# Patient Record
Sex: Female | Born: 1960
Health system: Southern US, Community
[De-identification: ages and names within clinical notes are randomized; demographics above are authoritative.]

## PROBLEM LIST (undated history)

## (undated) DIAGNOSIS — R922 Inconclusive mammogram: Secondary | ICD-10-CM

## (undated) DIAGNOSIS — E7800 Pure hypercholesterolemia, unspecified: Secondary | ICD-10-CM

## (undated) DIAGNOSIS — R9230 Dense breasts, unspecified: Secondary | ICD-10-CM

## (undated) DIAGNOSIS — C8190 Hodgkin lymphoma, unspecified, unspecified site: Secondary | ICD-10-CM

## (undated) HISTORY — DX: Inconclusive mammogram: R92.2

## (undated) HISTORY — DX: Pure hypercholesterolemia, unspecified: E78.00

## (undated) HISTORY — DX: Dense breasts, unspecified: R92.30

## (undated) HISTORY — DX: Hodgkin lymphoma, unspecified, unspecified site: C81.90

---

## 2008-06-07 DIAGNOSIS — C8190 Hodgkin lymphoma, unspecified, unspecified site: Secondary | ICD-10-CM

## 2008-06-07 HISTORY — DX: Hodgkin lymphoma, unspecified, unspecified site: C81.90

## 2012-06-07 LAB — HM COLONOSCOPY: HM Colonoscopy: NORMAL

## 2015-01-13 LAB — HM MAMMOGRAPHY: HM Mammogram: NORMAL

## 2015-08-14 ENCOUNTER — Ambulatory Visit: Payer: BC Managed Care – PPO | Admitting: Family Medicine

## 2015-09-03 ENCOUNTER — Encounter: Payer: Self-pay | Admitting: *Deleted

## 2015-09-03 ENCOUNTER — Telehealth: Payer: Self-pay | Admitting: *Deleted

## 2015-09-03 NOTE — Telephone Encounter (Signed)
Pre-Visit Call completed with patient and chart updated.   Pre-Visit Info documented in Specialty Comments under SnapShot.    

## 2015-09-04 ENCOUNTER — Ambulatory Visit (INDEPENDENT_AMBULATORY_CARE_PROVIDER_SITE_OTHER): Payer: BC Managed Care – PPO | Admitting: Family Medicine

## 2015-09-04 ENCOUNTER — Encounter: Payer: Self-pay | Admitting: Family Medicine

## 2015-09-04 VITALS — BP 130/82 | HR 72 | Temp 98.6°F | Ht 64.0 in | Wt 175.2 lb

## 2015-09-04 DIAGNOSIS — Z1329 Encounter for screening for other suspected endocrine disorder: Secondary | ICD-10-CM

## 2015-09-04 DIAGNOSIS — Z131 Encounter for screening for diabetes mellitus: Secondary | ICD-10-CM | POA: Diagnosis not present

## 2015-09-04 DIAGNOSIS — Z7189 Other specified counseling: Secondary | ICD-10-CM

## 2015-09-04 DIAGNOSIS — Z8639 Personal history of other endocrine, nutritional and metabolic disease: Secondary | ICD-10-CM | POA: Diagnosis not present

## 2015-09-04 DIAGNOSIS — Z130 Encounter for screening for diseases of the blood and blood-forming organs and certain disorders involving the immune mechanism: Secondary | ICD-10-CM | POA: Diagnosis not present

## 2015-09-04 DIAGNOSIS — E785 Hyperlipidemia, unspecified: Secondary | ICD-10-CM | POA: Insufficient documentation

## 2015-09-04 DIAGNOSIS — Z8571 Personal history of Hodgkin lymphoma: Secondary | ICD-10-CM | POA: Diagnosis not present

## 2015-09-04 DIAGNOSIS — N6012 Diffuse cystic mastopathy of left breast: Secondary | ICD-10-CM

## 2015-09-04 DIAGNOSIS — Z7689 Persons encountering health services in other specified circumstances: Secondary | ICD-10-CM

## 2015-09-04 LAB — CBC WITH DIFFERENTIAL/PLATELET
BASOS ABS: 0 10*3/uL (ref 0.0–0.1)
Basophils Relative: 0.5 % (ref 0.0–3.0)
EOS ABS: 0.2 10*3/uL (ref 0.0–0.7)
Eosinophils Relative: 4.2 % (ref 0.0–5.0)
HCT: 39.3 % (ref 36.0–46.0)
Hemoglobin: 13.2 g/dL (ref 12.0–15.0)
LYMPHS ABS: 0.9 10*3/uL (ref 0.7–4.0)
Lymphocytes Relative: 21.8 % (ref 12.0–46.0)
MCHC: 33.5 g/dL (ref 30.0–36.0)
MCV: 85.9 fl (ref 78.0–100.0)
MONO ABS: 0.3 10*3/uL (ref 0.1–1.0)
Monocytes Relative: 7 % (ref 3.0–12.0)
NEUTROS PCT: 66.5 % (ref 43.0–77.0)
Neutro Abs: 2.7 10*3/uL (ref 1.4–7.7)
Platelets: 238 10*3/uL (ref 150.0–400.0)
RBC: 4.58 Mil/uL (ref 3.87–5.11)
RDW: 13.6 % (ref 11.5–15.5)
WBC: 4 10*3/uL (ref 4.0–10.5)

## 2015-09-04 LAB — COMPREHENSIVE METABOLIC PANEL
ALK PHOS: 85 U/L (ref 39–117)
ALT: 20 U/L (ref 0–35)
AST: 25 U/L (ref 0–37)
Albumin: 4.5 g/dL (ref 3.5–5.2)
BILIRUBIN TOTAL: 0.6 mg/dL (ref 0.2–1.2)
BUN: 17 mg/dL (ref 6–23)
CO2: 26 meq/L (ref 19–32)
Calcium: 9.6 mg/dL (ref 8.4–10.5)
Chloride: 107 mEq/L (ref 96–112)
Creatinine, Ser: 0.85 mg/dL (ref 0.40–1.20)
GFR: 74.25 mL/min (ref >60.00)
GLUCOSE: 92 mg/dL (ref 70–99)
Potassium: 4 mEq/L (ref 3.5–5.1)
SODIUM: 142 meq/L (ref 135–145)
TOTAL PROTEIN: 7 g/dL (ref 6.0–8.3)

## 2015-09-04 LAB — LIPID PANEL
CHOL/HDL RATIO: 3
Cholesterol: 150 mg/dL (ref 0–200)
HDL: 46.7 mg/dL (ref >39.00)
LDL Cholesterol: 90 mg/dL (ref 0–99)
NONHDL: 103.23
Triglycerides: 65 mg/dL (ref 0.0–149.0)
VLDL: 13 mg/dL (ref 0.0–40.0)

## 2015-09-04 LAB — TSH: TSH: 1.5 u[IU]/mL (ref 0.35–4.50)

## 2015-09-04 LAB — HEMOGLOBIN A1C: Hgb A1c MFr Bld: 6.1 % (ref 4.6–6.5)

## 2015-09-04 MED ORDER — ASPIRIN 81 MG PO TABS: 81.0000 mg | ORAL_TABLET | Freq: Every day | ORAL | Status: AC

## 2015-09-04 MED ORDER — ATORVASTATIN CALCIUM 20 MG PO TABS: 20.0000 mg | ORAL_TABLET | Freq: Every day | ORAL | Status: DC

## 2015-09-04 NOTE — Patient Instructions (Signed)
It was very nice to meet you today!  We will arrange for a mammogram at the [facility] for you I will be in touch with your labs asap

## 2015-09-04 NOTE — Progress Notes (Signed)
[place] at [facility] [address], [place], [place] [address] [phone]  Date:  09/04/2015   Name:  Jennifer Wheeler   DOB:  Dec 13, 1960   MRN:  [id number]  PCP:  Lamar Blinks, MD    Chief Complaint: New Patient (Initial Visit)   History of Present Illness:  Jennifer Wheeler is a 55 y.o. very pleasant female patient who presents with the following:  Recently moved to the area from [place]. Her husband took a job in this area- he is a primary care sports medicine doctor who is working with [employer].    Dx with Hodgins lymphoma in 2010- she had chemo for 6 months.  A year ago she had her 5 year follow-up and was told that she does not need to see oncology any longer.  She does need to have annual labs per her PCP  She takes lipitor for her choleterol.    She is otherwise doing well.   She has never been a smoker They have 3 children- the older children are out of school, live [place] and [place], and the youngest is a Ship broker at [employer] [place].    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    She would like to do her labs today. She is fasting today.   She would like a rec for a GYN.  Also she has noticed a tender and "bumpy" area in her left breast- se has noted it over the last few weeks.  She has had normal mammos years since age 23.  She has dense breasts so often needs extra imaging. Last mammo done in august of 2016 She has started doing jazzercise for exercise.    There are no active problems to display for this patient.   Past Medical History  Diagnosis Date  . Hodgkin's lymphoma ([place]) 2010  . Hypercholesteremia   . Dense breast tissue     Past Surgical History  Procedure Laterality Date  . Cesarean section  1991    Social History  Substance Use  Topics  . Smoking status: Never Smoker   . Smokeless tobacco: Never Used  . Alcohol Use: 0.0 oz/week    0 Standard drinks or equivalent per week     Comment: occasionally    Family History  Problem Relation Age of Onset  . Heart disease Mother   . Heart attack Mother   . Diabetes Maternal Grandmother     No Known Allergies  Medication list has been reviewed and updated.  Current Outpatient Prescriptions on File Prior to Visit  Medication Sig Dispense Refill  . cetirizine (ZYRTEC) 10 MG tablet Take 10 mg by mouth daily.    . Multiple Vitamins-Minerals (MULTIPLE VITAMINS/WOMENS PO) Take 1 tablet by mouth daily.     No current facility-administered medications on file prior to visit.    Review of Systems:  As per HPI- otherwise negative.   Physical Examination: Filed Vitals:   09/04/15 0945  BP: 130/82  Pulse: 72  Temp: 98.6 F (37 C)    GEN: WDWN, NAD, Non-toxic, A & O x 3, overweight, looks well HEENT: Atraumatic, Normocephalic. Neck supple. No masses, No LAD.  Bilateral TM wnl, oropharynx normal.  PEERL,EOMI.   Ears and Nose: No external deformity. CV: RRR, No M/G/R.  No JVD. No thrill. No extra heart sounds. PULM: CTA B, no wheezes, crackles, rhonchi. No retractions. No resp. distress. No accessory muscle use. ABD: S, NT, ND, +BS. No rebound. No HSM. EXTR: No c/c/e NEURO Normal gait.  PSYCH: Normally interactive. Conversant. Not depressed or anxious appearing.  Calm demeanor.  Breast: normal exam, no masses/ dimpling/ discharge. She notes an area on the left breast from approx 2- 5:00 that feels like fibrocystic change but that she does identify as different from her baseline     Assessment and Plan: History of hyperlipidemia - Plan: Lipid panel, atorvastatin (LIPITOR) 20 MG tablet  Screening for deficiency anemia - Plan: CBC with Differential/Platelet  History of hodgkin's lymphoma - Plan: CBC with Differential/Platelet  Establishing care with new doctor,  encounter for  Screening for diabetes mellitus - Plan: Comprehensive metabolic panel, Hemoglobin A1c  Screening for thyroid disorder - Plan: TSH  Breast changes, fibrocystic, left - Plan: MM Digital Diagnostic Bilat  Labs today Referral for a diagnostic mammo Surveillance for lymphoma with a CBC with diff today Check her lipids- if doing well she can decrease her lipitor to 10 mg if she likes   Signed Lamar Blinks, MD

## 2015-09-05 ENCOUNTER — Encounter: Payer: Self-pay | Admitting: Family Medicine

## 2015-09-06 ENCOUNTER — Other Ambulatory Visit: Payer: Self-pay | Admitting: Family Medicine

## 2015-09-06 DIAGNOSIS — N6012 Diffuse cystic mastopathy of left breast: Secondary | ICD-10-CM

## 2015-10-06 ENCOUNTER — Ambulatory Visit
Admission: RE | Admit: 2015-10-06 | Discharge: 2015-10-06 | Disposition: A | Discharge status: Home / Self Care | Payer: BC Managed Care – PPO | Source: Ambulatory Visit | Attending: Family Medicine | Admitting: Family Medicine

## 2015-10-06 ENCOUNTER — Other Ambulatory Visit: Payer: Self-pay | Admitting: Family Medicine

## 2015-10-06 DIAGNOSIS — N6012 Diffuse cystic mastopathy of left breast: Secondary | ICD-10-CM

## 2015-10-23 ENCOUNTER — Telehealth: Payer: Self-pay

## 2015-10-23 NOTE — Telephone Encounter (Signed)
Patient called, states insurance  Denied claim  For MAmmogram. Will not pay for 2nd Mammogram. Patient feels if there was mor investigational info it may help. Please advise.

## 2015-10-24 NOTE — Telephone Encounter (Signed)
Called her back and LMOM- does she have any more info about denial such as a letter?  I need to find out who to contact to try and get this approved

## 2015-11-07 ENCOUNTER — Encounter: Payer: Self-pay | Admitting: Family Medicine

## 2015-11-19 ENCOUNTER — Encounter: Payer: Self-pay | Admitting: Family Medicine

## 2016-01-12 ENCOUNTER — Other Ambulatory Visit: Payer: Self-pay | Admitting: Family Medicine

## 2016-01-12 DIAGNOSIS — Z1231 Encounter for screening mammogram for malignant neoplasm of breast: Secondary | ICD-10-CM

## 2016-01-19 ENCOUNTER — Ambulatory Visit
Admission: RE | Admit: 2016-01-19 | Discharge: 2016-01-19 | Disposition: A | Discharge status: Home / Self Care | Payer: BC Managed Care – PPO | Source: Ambulatory Visit | Attending: Family Medicine | Admitting: Family Medicine

## 2016-01-19 DIAGNOSIS — Z1231 Encounter for screening mammogram for malignant neoplasm of breast: Secondary | ICD-10-CM

## 2016-04-12 ENCOUNTER — Ambulatory Visit (INDEPENDENT_AMBULATORY_CARE_PROVIDER_SITE_OTHER): Payer: BC Managed Care – PPO | Admitting: Family Medicine

## 2016-04-12 ENCOUNTER — Other Ambulatory Visit (HOSPITAL_COMMUNITY)
Admission: RE | Admit: 2016-04-12 | Discharge: 2016-04-12 | Disposition: A | Discharge status: Home / Self Care | Payer: BC Managed Care – PPO | Source: Ambulatory Visit | Attending: Family Medicine | Admitting: Family Medicine

## 2016-04-12 ENCOUNTER — Encounter: Payer: Self-pay | Admitting: Family Medicine

## 2016-04-12 VITALS — BP 126/82 | HR 84 | Temp 98.0°F | Ht 64.0 in | Wt 175.4 lb

## 2016-04-12 DIAGNOSIS — Z01419 Encounter for gynecological examination (general) (routine) without abnormal findings: Secondary | ICD-10-CM | POA: Diagnosis present

## 2016-04-12 DIAGNOSIS — Z1151 Encounter for screening for human papillomavirus (HPV): Secondary | ICD-10-CM | POA: Diagnosis not present

## 2016-04-12 DIAGNOSIS — J3089 Other allergic rhinitis: Secondary | ICD-10-CM | POA: Diagnosis not present

## 2016-04-12 DIAGNOSIS — H1013 Acute atopic conjunctivitis, bilateral: Secondary | ICD-10-CM

## 2016-04-12 DIAGNOSIS — E785 Hyperlipidemia, unspecified: Secondary | ICD-10-CM

## 2016-04-12 DIAGNOSIS — Z124 Encounter for screening for malignant neoplasm of cervix: Secondary | ICD-10-CM

## 2016-04-12 DIAGNOSIS — Z119 Encounter for screening for infectious and parasitic diseases, unspecified: Secondary | ICD-10-CM

## 2016-04-12 DIAGNOSIS — Z0000 Encounter for general adult medical examination without abnormal findings: Secondary | ICD-10-CM | POA: Diagnosis not present

## 2016-04-12 DIAGNOSIS — Z111 Encounter for screening for respiratory tuberculosis: Secondary | ICD-10-CM

## 2016-04-12 DIAGNOSIS — L679 Hair color and hair shaft abnormality, unspecified: Secondary | ICD-10-CM

## 2016-04-12 DIAGNOSIS — R7309 Other abnormal glucose: Secondary | ICD-10-CM

## 2016-04-12 MED ORDER — KETOTIFEN FUMARATE 0.025 % OP SOLN: 2.0000 [drp] | Freq: Two times a day (BID) | OPHTHALMIC | 11 refills | Status: DC

## 2016-04-12 MED ORDER — CETIRIZINE HCL 10 MG PO TABS: 10.0000 mg | ORAL_TABLET | Freq: Every day | ORAL | 11 refills | Status: DC

## 2016-04-12 NOTE — Progress Notes (Signed)
Pre visit review using our clinic review tool, if applicable. No additional management support is needed unless otherwise documented below in the visit note. 

## 2016-04-12 NOTE — Progress Notes (Signed)
[place] at [facility] [address] [place], [place] [address] [phone]  Date:  04/12/2016   Name:  Jennifer Wheeler   DOB:  1960/12/04   MRN:  [id number]  PCP:  Lamar Blinks, MD    Chief Complaint: Annual Exam (Pt here for physical. )   History of Present Illness:  Jennifer Wheeler is a 55 y.o. very pleasant female patient who presents with the following:  Here today for a CPE. Seen by myself in March of this year- they moved here this year from [place] as her husband took a job with [employer] sports [employer] (he is a sports med doc)  See partial HPI from March:  Dx with Hodgins lymphoma in 2010- she had chemo for 6 months.  A year ago she had her 5 year follow-up and was told that she does not need to see oncology any longer.  She does need to have annual labs per her PCP She takes lipitor for her choleterol.   She is otherwise doing well.   She has never been a smoker They have 3 children- the older children are out of school, live [place] and [place], and the youngest is a Ship broker at [employer] [place].   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   We did labs for her in March that looked fine-   mammo done in August of this year Last pap: 2014. She has never had an abnl pap  She does need hep C screening  Her oldest got engaged and is getting married in the spring, and her youngest is playing baseball at college.    She needs an rx for her allergy eye drops and her zyrtec so that she can get these under her flex account.    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  She would like to do a quantiferon gold for ease   Patient Active Problem List   Diagnosis Date Noted  . History of Hodgkin's lymphoma 09/04/2015  .  Hyperlipidemia 09/04/2015    Past Medical History:  Diagnosis Date  . Dense breast tissue   . Hodgkin's lymphoma ([place]) 2010  . Hypercholesteremia     Past Surgical History:  Procedure Laterality Date  . [place]    Social History  Substance Use Topics  . Smoking status: Never Smoker  . Smokeless tobacco: Never Used  . Alcohol use 0.0 oz/week     Comment: occasionally    Family History  Problem Relation Age of Onset  . Heart disease Mother   . Heart attack Mother   . Hypertension Mother   . Hyperlipidemia Maternal Grandmother   . Heart disease Maternal Grandmother   . Kidney Stones Father   . Melanoma Sister   . Kidney Stones Sister   . Colon cancer Maternal Grandfather   . Diabetes Maternal Grandfather   . Breast cancer Paternal Grandmother   . High blood pressure Paternal Grandmother     No Known Allergies  Medication list has been reviewed and updated.  Current Outpatient Prescriptions on File Prior to Visit  Medication Sig Dispense Refill  . aspirin 81 MG tablet Take 1 tablet (81 mg total) by mouth daily. 90 tablet 3  .  atorvastatin (LIPITOR) 20 MG tablet Take 1 tablet (20 mg total) by mouth daily. 90 tablet 3  . Biotin (BIOTIN MAXIMUM STRENGTH) 10 MG TABS Take 1 tablet by mouth daily.    . cetirizine (ZYRTEC) 10 MG tablet Take 10 mg by mouth daily.    . Multiple Vitamins-Minerals (MULTIPLE VITAMINS/WOMENS PO) Take 1 tablet by mouth daily.     No current facility-administered medications on file prior to visit.     Review of Systems:  As per HPI- otherwise negative.   Physical Examination: Vitals:   04/12/16 1413  BP: 126/82  Pulse: 84  Temp: 98 F (36.7 C)   Wt Readings from Last 3 Encounters:  04/12/16 175 lb 6.4 oz (79.6 kg)  09/04/15 175 lb 3.2 oz (79.5 kg)   Body mass index is 30.11 kg/m.  Ideal Body Weight:    GEN: WDWN, NAD, Non-toxic, A & O x 3, overweight, looks well HEENT: Atraumatic, Normocephalic. Neck supple.  No masses, No LAD.  Bilateral TM wnl, oropharynx normal.  PEERL,EOMI.  Ears and Nose: No external deformity. CV: RRR, No M/G/R. No JVD. No thrill. No extra heart sounds. PULM: CTA B, no wheezes, crackles, rhonchi. No retractions. No resp. distress. No accessory muscle use. ABD: S, NT, ND, +BS. No rebound. No HSM. EXTR: No c/c/e NEURO Normal gait.  PSYCH: Normally interactive. Conversant. Not depressed or anxious appearing.  Calm demeanor.  Breast: normal exam, no masses/ dimpling/ discharge Pelvic: normal, no vaginal lesions or discharge. Uterus normal, no CMT, no adnexal tendereness or masses She has noted some thinning of the hair in the lateral portion of the left eyebrow. She thinks this may have started back when she had treatment for her lymphoma   Assessment and Plan: Physical exam  Chronic non-seasonal allergic rhinitis, unspecified trigger - Plan: cetirizine (ZYRTEC) 10 MG tablet  Allergic conjunctivitis of both eyes - Plan: ketotifen (ZADITOR) 0.025 % ophthalmic solution  Dyslipidemia - Plan: Lipid panel  Elevated hemoglobin A1c - Plan: Hemoglobin A1C  Hair changes - Plan: Ambulatory referral to Dermatology  Screening examination for infectious disease - Plan: Hepatitis C antibody  Screening for cervical cancer - Plan: Cytology - PAP  Screening for tuberculosis - Plan: Quantiferon tb gold assay  Here today for a CPE and labs.   Pap today Referral to derm to eval her eyebrow concern  Signed Lamar Blinks, MD

## 2016-04-14 LAB — CYTOLOGY - PAP
DIAGNOSIS: NEGATIVE
HPV (WINDOPATH): NOT DETECTED

## 2016-04-19 ENCOUNTER — Telehealth: Payer: Self-pay | Admitting: Family Medicine

## 2016-04-19 ENCOUNTER — Encounter: Payer: Self-pay | Admitting: Family Medicine

## 2016-04-19 DIAGNOSIS — Z111 Encounter for screening for respiratory tuberculosis: Secondary | ICD-10-CM

## 2016-04-19 NOTE — Telephone Encounter (Signed)
Relation to pt: self Call back number: [phone]   Reason for call:   patient changed her mind and would like TB skin testing rather then Quantiferon tb gold assay. Patient would like to schedule nurse visit for Wednesday or Friday, requesting orders.

## 2016-04-19 NOTE — Telephone Encounter (Signed)
Ok placed PPD order

## 2016-04-20 NOTE — Telephone Encounter (Signed)
Patient scheduled for 04/21/2016

## 2016-04-21 ENCOUNTER — Encounter: Payer: Self-pay | Admitting: Family Medicine

## 2016-04-21 ENCOUNTER — Ambulatory Visit (INDEPENDENT_AMBULATORY_CARE_PROVIDER_SITE_OTHER): Payer: BC Managed Care – PPO

## 2016-04-21 DIAGNOSIS — Z111 Encounter for screening for respiratory tuberculosis: Secondary | ICD-10-CM

## 2016-04-21 NOTE — Progress Notes (Signed)
Pre visit review using our clinic tool,if applicable. No additional management support is needed unless otherwise documented below in the visit note.   Patient in for PPD. Given left forearm SQ. Advised to return on Friday to have PPD read. Patient agreed

## 2016-04-23 ENCOUNTER — Other Ambulatory Visit (INDEPENDENT_AMBULATORY_CARE_PROVIDER_SITE_OTHER): Payer: BC Managed Care – PPO

## 2016-04-23 ENCOUNTER — Encounter: Payer: Self-pay | Admitting: Family Medicine

## 2016-04-23 DIAGNOSIS — E785 Hyperlipidemia, unspecified: Secondary | ICD-10-CM

## 2016-04-23 DIAGNOSIS — Z119 Encounter for screening for infectious and parasitic diseases, unspecified: Secondary | ICD-10-CM

## 2016-04-23 DIAGNOSIS — R7309 Other abnormal glucose: Secondary | ICD-10-CM | POA: Diagnosis not present

## 2016-04-23 LAB — TB SKIN TEST
Induration: 0 mm
TB Skin Test: NEGATIVE

## 2016-04-23 LAB — LIPID PANEL
CHOLESTEROL: 164 mg/dL (ref 0–200)
HDL: 48.1 mg/dL (ref >39.00)
LDL Cholesterol: 95 mg/dL (ref 0–99)
NonHDL: 115.84
Total CHOL/HDL Ratio: 3
Triglycerides: 102 mg/dL (ref 0.0–149.0)
VLDL: 20.4 mg/dL (ref 0.0–40.0)

## 2016-04-23 LAB — HEMOGLOBIN A1C: HEMOGLOBIN A1C: 5.8 % (ref 4.6–6.5)

## 2016-04-24 LAB — HEPATITIS C ANTIBODY: HCV Ab: NEGATIVE

## 2016-04-26 ENCOUNTER — Other Ambulatory Visit: Payer: BC Managed Care – PPO

## 2016-10-11 ENCOUNTER — Other Ambulatory Visit: Payer: Self-pay | Admitting: Family Medicine

## 2016-10-11 DIAGNOSIS — Z1231 Encounter for screening mammogram for malignant neoplasm of breast: Secondary | ICD-10-CM

## 2017-01-19 ENCOUNTER — Ambulatory Visit
Admission: RE | Admit: 2017-01-19 | Discharge: 2017-01-19 | Disposition: A | Discharge status: Home / Self Care | Payer: BC Managed Care – PPO | Source: Ambulatory Visit | Attending: Family Medicine | Admitting: Family Medicine

## 2017-01-19 DIAGNOSIS — Z1231 Encounter for screening mammogram for malignant neoplasm of breast: Secondary | ICD-10-CM

## 2017-01-21 ENCOUNTER — Other Ambulatory Visit: Payer: Self-pay | Admitting: Family Medicine

## 2017-01-21 DIAGNOSIS — R928 Other abnormal and inconclusive findings on diagnostic imaging of breast: Secondary | ICD-10-CM

## 2017-01-24 ENCOUNTER — Telehealth: Payer: Self-pay | Admitting: *Deleted

## 2017-01-24 NOTE — Telephone Encounter (Signed)
Received Physician Orders from [place]; forwarded to provider/SLS 08/20

## 2017-01-28 ENCOUNTER — Ambulatory Visit
Admission: RE | Admit: 2017-01-28 | Discharge: 2017-01-28 | Disposition: A | Discharge status: Home / Self Care | Payer: BC Managed Care – PPO | Source: Ambulatory Visit | Attending: Family Medicine | Admitting: Family Medicine

## 2017-01-28 ENCOUNTER — Ambulatory Visit: Admission: RE | Admit: 2017-01-28 | Payer: BC Managed Care – PPO | Source: Ambulatory Visit

## 2017-01-28 DIAGNOSIS — R928 Other abnormal and inconclusive findings on diagnostic imaging of breast: Secondary | ICD-10-CM

## 2017-04-12 NOTE — Progress Notes (Addendum)
[place] at [facility] [address], [place], [place] [address] [phone]  Date:  04/14/2017   Name:  Jennifer Wheeler   DOB:  03/11/1961   MRN:  [id number]  PCP:  Darreld Mclean, MD    Chief Complaint: Annual Exam   History of Present Illness:  Jennifer Wheeler is a 56 y.o. very pleasant female patient who presents with the following:  CPE today History of Hodgkins' disease and hyperlipidemia  Pap: 2017, negative, - HPV Mammo: 8/18 Colon: 2014 Flu:  done Tetanus: update today Labs:  Due  I last saw her about a year ago:  Here today for a CPE. Seen by myself in March of this year- they moved here this year from [place] as her husband took a job with [employer] sports [employer] (he is a sports med doc)  See partial HPI from March:  Dx with Hodgins lymphoma in 2010- she had chemo for 6 months. A year ago she had her 5 year follow-up and was told that she does not need to see oncology any longer. She does need to have annual labs per her PCP She takes lipitor for her choleterol.  She is otherwise doing well.  She has never been a smoker They have 3 children- the older children are out of school, live [place] and [place], and the youngest is a Ship broker at [employer] [place].  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   We did labs for her in March that looked fine-  mammo done in August of this year Last pap: 2014. She has never had an abnl pap  She does need hep C screening  Her oldest got engaged and is getting married in the spring, and her youngest is playing baseball at college.    She is doing well overall She needs her blood tests done to follow-up on her Hodgkins today She has lost a couple of lbs, and plans to try weight watchers She does walk  for exercise and enjoys jazzercise  Her oldest child got married last year, the middle one is engaged, and her youngest is in med school at [employer].   She is not fasting today- she had an english muffin and some meat, cheese.  However her cholesterol has looked fine on her lipitor in the past- will go ahead and get labs for her today  She is taking 10 mg of lipitor right now and tolerating this well  Patient Active Problem List   Diagnosis Date Noted  . History of Hodgkin's lymphoma 09/04/2015  . Hyperlipidemia 09/04/2015    Past Medical History:  Diagnosis Date  . Dense breast tissue   . Hodgkin's lymphoma ([place]) 2010  . Hypercholesteremia     Past Surgical History:  Procedure Laterality Date  . CESAREAN SECTION  1991    Social History   Tobacco Use  . Smoking status: Never Smoker  . Smokeless tobacco: Never Used  Substance Use Topics  . Alcohol use: Yes    Alcohol/week: 0.0 oz    Comment: occasionally  . Drug use: No    Family History  Problem Relation Age of Onset  . Heart disease Mother   . Heart attack Mother   . Hypertension Mother   . Kidney Stones Father   .  Hyperlipidemia Maternal Grandmother   . Heart disease Maternal Grandmother   . Melanoma Sister   . Kidney Stones Sister   . Colon cancer Maternal Grandfather   . Diabetes Maternal Grandfather   . Breast cancer Paternal Grandmother   . High blood pressure Paternal Grandmother     No Known Allergies  Medication list has been reviewed and updated.  Current Outpatient Medications on File Prior to Visit  Medication Sig Dispense Refill  . aspirin 81 MG tablet Take 1 tablet (81 mg total) by mouth daily. 90 tablet 3  . Biotin (BIOTIN MAXIMUM STRENGTH) 10 MG TABS Take 1 tablet by mouth daily.    . cetirizine (ZYRTEC) 10 MG tablet Take 1 tablet (10 mg total) by mouth daily. 30 tablet 11  . ketotifen (ZADITOR) 0.025 % ophthalmic solution Place 2 drops into both eyes 2 (two) times daily. 5 mL 11  .  Multiple Vitamins-Minerals (MULTIPLE VITAMINS/WOMENS PO) Take 1 tablet by mouth daily.     No current facility-administered medications on file prior to visit.     Review of Systems:  As per HPI- otherwise negative. No CP or SOB with exercise No breast changes No ST or cough Skin check next month   Physical Examination: Vitals:   04/14/17 1316  BP: 134/82  Pulse: 67  Resp: 16  Temp: 98.8 F (37.1 C)  SpO2: 100%   Vitals:   04/14/17 1316  Weight: 171 lb 6.4 oz (77.7 kg)   Body mass index is 29.42 kg/m. Ideal Body Weight:    GEN: WDWN, NAD, Non-toxic, A & O x 3, overweight, looks well otherwise  HEENT: Atraumatic, Normocephalic. Neck supple. No masses, No LAD.  Bilateral TM wnl, oropharynx normal.  PEERL,EOMI.   Ears and Nose: No external deformity. CV: RRR, No M/G/R. No JVD. No thrill. No extra heart sounds. PULM: CTA B, no wheezes, crackles, rhonchi. No retractions. No resp. distress. No accessory muscle use. ABD: S, NT, ND, +BS. No rebound. No HSM. EXTR: No c/c/e NEURO Normal gait.  PSYCH: Normally interactive. Conversant. Not depressed or anxious appearing.  Calm demeanor.    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 Encouraged continued exercise   Signed Lamar Blinks, MD  Received her labs Results for orders placed or performed in visit on 04/14/17  Comprehensive metabolic panel  Result  Value Ref Range   Sodium 139 135 - 145 mEq/L   Potassium 3.8 3.5 - 5.1 mEq/L   Chloride 104 96 - 112 mEq/L   CO2 26 19 - 32 mEq/L   Glucose, Bld 103 (H) 70 - 99 mg/dL   BUN 19 6 - 23 mg/dL   Creatinine, Ser 0.90 0.40 - 1.20 mg/dL   Total Bilirubin 0.6 0.2 - 1.2 mg/dL   Alkaline Phosphatase 69 39 - 117 U/L   AST 37 0 - 37 U/L   ALT 24 0 - 35 U/L   Total Protein 7.2 6.0 - 8.3  g/dL   Albumin 4.5 3.5 - 5.2 g/dL   Calcium 9.9 8.4 - 10.5 mg/dL   GFR 69.09 >60.00 mL/min  Hemoglobin A1c  Result Value Ref Range   Hgb A1c MFr Bld 5.9 4.6 - 6.5 %  Lipid panel  Result Value Ref Range   Cholesterol 158 0 - 200 mg/dL   Triglycerides 66.0 0.0 - 149.0 mg/dL   HDL 47.90 >39.00 mg/dL   VLDL 13.2 0.0 - 40.0 mg/dL   LDL Cholesterol 97 0 - 99 mg/dL   Total CHOL/HDL Ratio 3    NonHDL 109.75   CBC with Differential/Platelet  Result Value Ref Range   WBC 4.6 4.0 - 10.5 K/uL   RBC 4.71 3.87 - 5.11 Mil/uL   Hemoglobin 13.9 12.0 - 15.0 g/dL   HCT 41.3 36.0 - 46.0 %   MCV 87.7 78.0 - 100.0 fl   MCHC 33.6 30.0 - 36.0 g/dL   RDW 13.2 11.5 - 15.5 %   Platelets 229.0 150.0 - 400.0 K/uL   Neutrophils Relative % 56.8 43.0 - 77.0 %   Lymphocytes Relative 28.9 12.0 - 46.0 %   Monocytes Relative 7.8 3.0 - 12.0 %   Eosinophils Relative 4.8 0.0 - 5.0 %   Basophils Relative 1.7 0.0 - 3.0 %   Neutro Abs 2.6 1.4 - 7.7 K/uL   Lymphs Abs 1.3 0.7 - 4.0 K/uL   Monocytes Absolute 0.4 0.1 - 1.0 K/uL   Eosinophils Absolute 0.2 0.0 - 0.7 K/uL   Basophils Absolute 0.1 0.0 - 0.1 K/uL   Message to pt- A1c is stable

## 2017-04-14 ENCOUNTER — Encounter: Payer: Self-pay | Admitting: Family Medicine

## 2017-04-14 ENCOUNTER — Ambulatory Visit (INDEPENDENT_AMBULATORY_CARE_PROVIDER_SITE_OTHER): Payer: BC Managed Care – PPO | Admitting: Family Medicine

## 2017-04-14 VITALS — BP 134/82 | HR 67 | Temp 98.8°F | Resp 16 | Wt 171.4 lb

## 2017-04-14 DIAGNOSIS — R7303 Prediabetes: Secondary | ICD-10-CM | POA: Insufficient documentation

## 2017-04-14 DIAGNOSIS — Z8571 Personal history of Hodgkin lymphoma: Secondary | ICD-10-CM | POA: Diagnosis not present

## 2017-04-14 DIAGNOSIS — Z0000 Encounter for general adult medical examination without abnormal findings: Secondary | ICD-10-CM

## 2017-04-14 DIAGNOSIS — Z23 Encounter for immunization: Secondary | ICD-10-CM | POA: Diagnosis not present

## 2017-04-14 DIAGNOSIS — Z8639 Personal history of other endocrine, nutritional and metabolic disease: Secondary | ICD-10-CM | POA: Diagnosis not present

## 2017-04-14 DIAGNOSIS — Z131 Encounter for screening for diabetes mellitus: Secondary | ICD-10-CM

## 2017-04-14 DIAGNOSIS — Z130 Encounter for screening for diseases of the blood and blood-forming organs and certain disorders involving the immune mechanism: Secondary | ICD-10-CM

## 2017-04-14 LAB — CBC WITH DIFFERENTIAL/PLATELET
Basophils Absolute: 0.1 10*3/uL (ref 0.0–0.1)
Basophils Relative: 1.7 % (ref 0.0–3.0)
EOS PCT: 4.8 % (ref 0.0–5.0)
Eosinophils Absolute: 0.2 10*3/uL (ref 0.0–0.7)
HCT: 41.3 % (ref 36.0–46.0)
Hemoglobin: 13.9 g/dL (ref 12.0–15.0)
LYMPHS ABS: 1.3 10*3/uL (ref 0.7–4.0)
Lymphocytes Relative: 28.9 % (ref 12.0–46.0)
MCHC: 33.6 g/dL (ref 30.0–36.0)
MCV: 87.7 fl (ref 78.0–100.0)
MONO ABS: 0.4 10*3/uL (ref 0.1–1.0)
Monocytes Relative: 7.8 % (ref 3.0–12.0)
NEUTROS ABS: 2.6 10*3/uL (ref 1.4–7.7)
NEUTROS PCT: 56.8 % (ref 43.0–77.0)
PLATELETS: 229 10*3/uL (ref 150.0–400.0)
RBC: 4.71 Mil/uL (ref 3.87–5.11)
RDW: 13.2 % (ref 11.5–15.5)
WBC: 4.6 10*3/uL (ref 4.0–10.5)

## 2017-04-14 LAB — HEMOGLOBIN A1C: HEMOGLOBIN A1C: 5.9 % (ref 4.6–6.5)

## 2017-04-14 LAB — LIPID PANEL
CHOLESTEROL: 158 mg/dL (ref 0–200)
HDL: 47.9 mg/dL (ref >39.00)
LDL Cholesterol: 97 mg/dL (ref 0–99)
NONHDL: 109.75
TRIGLYCERIDES: 66 mg/dL (ref 0.0–149.0)
Total CHOL/HDL Ratio: 3
VLDL: 13.2 mg/dL (ref 0.0–40.0)

## 2017-04-14 LAB — COMPREHENSIVE METABOLIC PANEL
ALBUMIN: 4.5 g/dL (ref 3.5–5.2)
ALT: 24 U/L (ref 0–35)
AST: 37 U/L (ref 0–37)
Alkaline Phosphatase: 69 U/L (ref 39–117)
BUN: 19 mg/dL (ref 6–23)
CHLORIDE: 104 meq/L (ref 96–112)
CO2: 26 mEq/L (ref 19–32)
Calcium: 9.9 mg/dL (ref 8.4–10.5)
Creatinine, Ser: 0.9 mg/dL (ref 0.40–1.20)
GFR: 69.09 mL/min (ref >60.00)
Glucose, Bld: 103 mg/dL — ABNORMAL HIGH (ref 70–99)
POTASSIUM: 3.8 meq/L (ref 3.5–5.1)
SODIUM: 139 meq/L (ref 135–145)
Total Bilirubin: 0.6 mg/dL (ref 0.2–1.2)
Total Protein: 7.2 g/dL (ref 6.0–8.3)

## 2017-04-14 MED ORDER — ATORVASTATIN CALCIUM 10 MG PO TABS: 10.0000 mg | ORAL_TABLET | Freq: Every day | ORAL | 3 refills | Status: DC

## 2017-04-14 NOTE — Patient Instructions (Signed)
It was a pleasure to see you today as always!  Take care and I will be in touch with your labs asap You got your Tdap booster today- this is a tetanus shot that also protects against the whooping cough (pertussis)    Health Maintenance for Postmenopausal Women Menopause is a normal process in which your reproductive ability comes to an end. This process happens gradually over a span of months to years, usually between the ages of 38 and 51. Menopause is complete when you have missed 12 consecutive menstrual periods. It is important to talk with your health care provider about some of the most common conditions that affect postmenopausal women, such as heart disease, cancer, and bone loss (osteoporosis). Adopting a healthy lifestyle and getting preventive care can help to promote your health and wellness. Those actions can also lower your chances of developing some of these common conditions. What should I know about menopause? During menopause, you may experience a number of symptoms, such as:  Moderate-to-severe hot flashes.  Night sweats.  Decrease in sex drive.  Mood swings.  Headaches.  Tiredness.  Irritability.  Memory problems.  Insomnia.  Choosing to treat or not to treat menopausal changes is an individual decision that you make with your health care provider. What should I know about hormone replacement therapy and supplements? Hormone therapy products are effective for treating symptoms that are associated with menopause, such as hot flashes and night sweats. Hormone replacement carries certain risks, especially as you become older. If you are thinking about using estrogen or estrogen with progestin treatments, discuss the benefits and risks with your health care provider. What should I know about heart disease and stroke? Heart disease, heart attack, and stroke become more likely as you age. This may be due, in part, to the hormonal changes that your body experiences during  menopause. These can affect how your body processes dietary fats, triglycerides, and cholesterol. Heart attack and stroke are both medical emergencies. There are many things that you can do to help prevent heart disease and stroke:  Have your blood pressure checked at least every 1-2 years. High blood pressure causes heart disease and increases the risk of stroke.  If you are 84-36 years old, ask your health care provider if you should take aspirin to prevent a heart attack or a stroke.  Do not use any tobacco products, including cigarettes, chewing tobacco, or electronic cigarettes. If you need help quitting, ask your health care provider.  It is important to eat a healthy diet and maintain a healthy weight. ? Be sure to include plenty of vegetables, fruits, low-fat dairy products, and lean protein. ? Avoid eating foods that are high in solid fats, added sugars, or salt (sodium).  Get regular exercise. This is one of the most important things that you can do for your health. ? Try to exercise for at least 150 minutes each week. The type of exercise that you do should increase your heart rate and make you sweat. This is known as moderate-intensity exercise. ? Try to do strengthening exercises at least twice each week. Do these in addition to the moderate-intensity exercise.  Know your numbers.Ask your health care provider to check your cholesterol and your blood glucose. Continue to have your blood tested as directed by your health care provider.  What should I know about cancer screening? There are several types of cancer. Take the following steps to reduce your risk and to catch any cancer development as early  as possible. Breast Cancer  Practice breast self-awareness. ? This means understanding how your breasts normally appear and feel. ? It also means doing regular breast self-exams. Let your health care provider know about any changes, no matter how small.  If you are 15 or older,  have a clinician do a breast exam (clinical breast exam or CBE) every year. Depending on your age, family history, and medical history, it may be recommended that you also have a yearly breast X-ray (mammogram).  If you have a family history of breast cancer, talk with your health care provider about genetic screening.  If you are at high risk for breast cancer, talk with your health care provider about having an MRI and a mammogram every year.  Breast cancer (BRCA) gene test is recommended for women who have family members with BRCA-related cancers. Results of the assessment will determine the need for genetic counseling and BRCA1 and for BRCA2 testing. BRCA-related cancers include these types: ? Breast. This occurs in males or females. ? Ovarian. ? Tubal. This may also be called fallopian tube cancer. ? Cancer of the abdominal or pelvic lining (peritoneal cancer). ? Prostate. ? Pancreatic.  Cervical, Uterine, and Ovarian Cancer Your health care provider may recommend that you be screened regularly for cancer of the pelvic organs. These include your ovaries, uterus, and vagina. This screening involves a pelvic exam, which includes checking for microscopic changes to the surface of your cervix (Pap test).  For women ages 21-65, health care providers may recommend a pelvic exam and a Pap test every three years. For women ages 64-65, they may recommend the Pap test and pelvic exam, combined with testing for human papilloma virus (HPV), every five years. Some types of HPV increase your risk of cervical cancer. Testing for HPV may also be done on women of any age who have unclear Pap test results.  Other health care providers may not recommend any screening for nonpregnant women who are considered low risk for pelvic cancer and have no symptoms. Ask your health care provider if a screening pelvic exam is right for you.  If you have had past treatment for cervical cancer or a condition that could  lead to cancer, you need Pap tests and screening for cancer for at least 20 years after your treatment. If Pap tests have been discontinued for you, your risk factors (such as having a new sexual partner) need to be reassessed to determine if you should start having screenings again. Some women have medical problems that increase the chance of getting cervical cancer. In these cases, your health care provider may recommend that you have screening and Pap tests more often.  If you have a family history of uterine cancer or ovarian cancer, talk with your health care provider about genetic screening.  If you have vaginal bleeding after reaching menopause, tell your health care provider.  There are currently no reliable tests available to screen for ovarian cancer.  Lung Cancer Lung cancer screening is recommended for adults 95-73 years old who are at high risk for lung cancer because of a history of smoking. A yearly low-dose CT scan of the lungs is recommended if you:  Currently smoke.  Have a history of at least 30 pack-years of smoking and you currently smoke or have quit within the past 15 years. A pack-year is smoking an average of one pack of cigarettes per day for one year.  Yearly screening should:  Continue until it has been 15  years since you quit.  Stop if you develop a health problem that would prevent you from having lung cancer treatment.  Colorectal Cancer  This type of cancer can be detected and can often be prevented.  Routine colorectal cancer screening usually begins at age 68 and continues through age 23.  If you have risk factors for colon cancer, your health care provider may recommend that you be screened at an earlier age.  If you have a family history of colorectal cancer, talk with your health care provider about genetic screening.  Your health care provider may also recommend using home test kits to check for hidden blood in your stool.  A small camera at the  end of a tube can be used to examine your colon directly (sigmoidoscopy or colonoscopy). This is done to check for the earliest forms of colorectal cancer.  Direct examination of the colon should be repeated every 5-10 years until age 37. However, if early forms of precancerous polyps or small growths are found or if you have a family history or genetic risk for colorectal cancer, you may need to be screened more often.  Skin Cancer  Check your skin from head to toe regularly.  Monitor any moles. Be sure to tell your health care provider: ? About any new moles or changes in moles, especially if there is a change in a mole's shape or color. ? If you have a mole that is larger than the size of a pencil eraser.  If any of your family members has a history of skin cancer, especially at a young age, talk with your health care provider about genetic screening.  Always use sunscreen. Apply sunscreen liberally and repeatedly throughout the day.  Whenever you are outside, protect yourself by wearing long sleeves, pants, a wide-brimmed hat, and sunglasses.  What should I know about osteoporosis? Osteoporosis is a condition in which bone destruction happens more quickly than new bone creation. After menopause, you may be at an increased risk for osteoporosis. To help prevent osteoporosis or the bone fractures that can happen because of osteoporosis, the following is recommended:  If you are 20-65 years old, get at least 1,000 mg of calcium and at least 600 mg of vitamin D per day.  If you are older than age 12 but younger than age 40, get at least 1,200 mg of calcium and at least 600 mg of vitamin D per day.  If you are older than age 29, get at least 1,200 mg of calcium and at least 800 mg of vitamin D per day.  Smoking and excessive alcohol intake increase the risk of osteoporosis. Eat foods that are rich in calcium and vitamin D, and do weight-bearing exercises several times each week as directed  by your health care provider. What should I know about how menopause affects my mental health? Depression may occur at any age, but it is more common as you become older. Common symptoms of depression include:  Low or sad mood.  Changes in sleep patterns.  Changes in appetite or eating patterns.  Feeling an overall lack of motivation or enjoyment of activities that you previously enjoyed.  Frequent crying spells.  Talk with your health care provider if you think that you are experiencing depression. What should I know about immunizations? It is important that you get and maintain your immunizations. These include:  Tetanus, diphtheria, and pertussis (Tdap) booster vaccine.  Influenza every year before the flu season begins.  Pneumonia vaccine.  Shingles vaccine.  Your health care provider may also recommend other immunizations. This information is not intended to replace advice given to you by your health care provider. Make sure you discuss any questions you have with your health care provider. Document Released: 07/16/2005 Document Revised: 12/12/2015 Document Reviewed: 02/25/2015 Elsevier Interactive Patient Education  2018 [employer].

## 2017-11-02 ENCOUNTER — Other Ambulatory Visit: Payer: Self-pay | Admitting: Family Medicine

## 2017-11-02 DIAGNOSIS — Z1231 Encounter for screening mammogram for malignant neoplasm of breast: Secondary | ICD-10-CM

## 2018-01-20 ENCOUNTER — Ambulatory Visit
Admission: RE | Admit: 2018-01-20 | Discharge: 2018-01-20 | Disposition: A | Discharge status: Home / Self Care | Payer: BC Managed Care – PPO | Source: Ambulatory Visit | Attending: Family Medicine | Admitting: Family Medicine

## 2018-01-20 DIAGNOSIS — Z1231 Encounter for screening mammogram for malignant neoplasm of breast: Secondary | ICD-10-CM

## 2018-04-10 ENCOUNTER — Other Ambulatory Visit: Payer: Self-pay | Admitting: Family Medicine

## 2018-04-10 DIAGNOSIS — Z8639 Personal history of other endocrine, nutritional and metabolic disease: Secondary | ICD-10-CM

## 2018-04-17 ENCOUNTER — Encounter: Payer: BC Managed Care – PPO | Admitting: Family Medicine

## 2018-04-23 NOTE — Progress Notes (Signed)
[facility] [place], [place], [place] [address] [phone]  Date:  04/24/2018   Name:  Jennifer Wheeler   DOB:  1961-03-31   MRN:  [id number]  PCP:  Darreld Mclean, MD    Chief Complaint: Annual Exam   History of Present Illness:  Jennifer Wheeler is a 57 y.o. 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 She does not have time to drink much water as she is working so hard, this can make her feel a bit dehydrated by the end of the day   She is still exercising and has lost a few labs She likes to go to [place] No CP or SOB No PMB Mood is good, no depression   Her middle son is now happily married as well,her youngest is doing well in his 2nd year at [employer] Readings from Last 3 Encounters:  04/24/18 169 lb (76.7 kg)  04/14/17 171 lb 6.4 oz (77.7 kg)  04/12/16 175 lb 6.4 oz (79.6 kg)    From our visit a year ago: They moved here this year from [place] as her husband took a job with [employer] (he is a sports med Firefighter) Dx with [place] in 2010- she had chemo for 6 months. A year ago she had her 5 year follow-up and was told that she does not need to see oncology any longer. She does need to have annual labs per her PCP She takes lipitor for her choleterol.  She is otherwise doing well.  She has never been a smoker They have 3 children- the older children are out of school, live [place] and [place], and the youngest is a Ship broker at [employer] of  [place].  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 She does walk for exercise and enjoys jazzercise Her oldest child got married last year, the middle one is engaged, and her youngest is in med school at [employer].    Patient Active Problem List   Diagnosis Date Noted  . Pre-diabetes 04/14/2017  . History of Hodgkin's lymphoma 09/04/2015  . Hyperlipidemia 09/04/2015    Past Medical History:  Diagnosis Date  . Dense breast tissue   . Hodgkin's lymphoma ([place]) 2010  . Hypercholesteremia     Past Surgical History:  Procedure Laterality Date  . CESAREAN SECTION  1991    Social History   Tobacco Use  . Smoking status: Never Smoker  . Smokeless tobacco: Never Used  Substance Use Topics  . Alcohol use: Yes    Alcohol/week: 0.0 standard drinks    Comment: occasionally  . Drug use: No    Family History  Problem Relation Age of Onset  . Heart disease Mother   . Heart attack Mother   . Hypertension Mother   . Kidney Stones Father   .  Hyperlipidemia Maternal Grandmother   . Heart disease Maternal Grandmother   . Melanoma Sister   . Kidney Stones Sister   . Colon cancer Maternal Grandfather   . Diabetes Maternal Grandfather   . Breast cancer Paternal Grandmother   . High blood pressure Paternal Grandmother     No Known Allergies  Medication list has been reviewed and updated.  Current Outpatient Medications on File Prior to Visit  Medication Sig Dispense Refill  . aspirin 81 MG tablet Take 1 tablet (81 mg total) by mouth daily. 90 tablet 3  . atorvastatin (LIPITOR) 10 MG tablet TAKE 1 TABLET (10 MG TOTAL) DAILY BY MOUTH. 15 tablet 0  . Biotin (BIOTIN MAXIMUM STRENGTH) 10 MG TABS Take 1 tablet by mouth daily.    . Multiple Vitamins-Minerals (MULTIPLE VITAMINS/WOMENS PO) Take 1 tablet by mouth daily.    . cetirizine  (ZYRTEC) 10 MG tablet Take 1 tablet (10 mg total) by mouth daily. (Patient not taking: Reported on 04/24/2018) 30 tablet 11   No current facility-administered medications on file prior to visit.     Review of Systems:  As per HPI- otherwise negative. BP Readings from Last 3 Encounters:  04/24/18 130/70  04/14/17 134/82  04/12/16 126/82    No breast or skin concerns   Physical Examination: Vitals:   04/24/18 1338  BP: 130/70  Pulse: 78  Resp: 16  Temp: 98.1 F (36.7 C)  SpO2: 99%   Vitals:   04/24/18 1338  Weight: 169 lb (76.7 kg)  Height: 5\' 4"  (1.626 m)   Body mass index is 29.01 kg/m. Ideal Body Weight: Weight in (lb) to have BMI = 25: 145.3  GEN: WDWN, NAD, Non-toxic, A & O x 3, overweight, looks well  HEENT: Atraumatic, Normocephalic. Neck supple. No masses, No LAD. Bilateral TM wnl, oropharynx normal.  PEERL,EOMI.   Ears and Nose: No external deformity. CV: RRR, No M/G/R. No JVD. No thrill. No extra heart sounds. PULM: CTA B, no wheezes, crackles, rhonchi. No retractions. No resp. distress. No accessory muscle use. ABD: S, NT, ND. No rebound. No HSM. EXTR: No c/c/e NEURO Normal gait.  PSYCH: Normally interactive. Conversant. Not depressed or anxious appearing.  Calm demeanor.    Assessment and Plan: Physical exam  Screening for deficiency anemia - Plan: CBC  History of Hodgkin's lymphoma - Plan: CBC, Comprehensive metabolic panel  Screening for diabetes mellitus - Plan: Comprehensive metabolic panel, Hemoglobin A1c  Pre-diabetes - Plan: Hemoglobin A1c  Mixed hyperlipidemia - Plan: Lipid panel  History of hyperlipidemia - Plan: atorvastatin (LIPITOR) 10 MG tablet  CPE today She plans to come in for fasting labs in the next few days  Continue lipitor, refilled for her today She may come in for shinrix later on Otherwise all is UTD and she has no concerns   Signed Lamar Blinks, MD

## 2018-04-24 ENCOUNTER — Ambulatory Visit (INDEPENDENT_AMBULATORY_CARE_PROVIDER_SITE_OTHER): Payer: Managed Care, Other (non HMO) | Admitting: Family Medicine

## 2018-04-24 ENCOUNTER — Encounter: Payer: Self-pay | Admitting: Family Medicine

## 2018-04-24 VITALS — BP 130/70 | HR 78 | Temp 98.1°F | Resp 16 | Ht 64.0 in | Wt 169.0 lb

## 2018-04-24 DIAGNOSIS — Z8571 Personal history of Hodgkin lymphoma: Secondary | ICD-10-CM

## 2018-04-24 DIAGNOSIS — Z130 Encounter for screening for diseases of the blood and blood-forming organs and certain disorders involving the immune mechanism: Secondary | ICD-10-CM

## 2018-04-24 DIAGNOSIS — R7303 Prediabetes: Secondary | ICD-10-CM

## 2018-04-24 DIAGNOSIS — Z131 Encounter for screening for diabetes mellitus: Secondary | ICD-10-CM | POA: Diagnosis not present

## 2018-04-24 DIAGNOSIS — Z0000 Encounter for general adult medical examination without abnormal findings: Secondary | ICD-10-CM

## 2018-04-24 DIAGNOSIS — Z8639 Personal history of other endocrine, nutritional and metabolic disease: Secondary | ICD-10-CM

## 2018-04-24 DIAGNOSIS — E782 Mixed hyperlipidemia: Secondary | ICD-10-CM

## 2018-04-24 MED ORDER — ATORVASTATIN CALCIUM 10 MG PO TABS: 10.0000 mg | ORAL_TABLET | Freq: Every day | ORAL | 3 refills | Status: DC

## 2018-04-24 NOTE — Patient Instructions (Addendum)
Great to see you today! Please come in for fasting labs at your convenience Also consider the Shingrix vaccine to prevent shingles; we can give this to you if you like, it is a 2 shot series   Continue exercise, great job with weight loss Best of luck with your new job!    Health Maintenance, Female Adopting a healthy lifestyle and getting preventive care can go a long way to promote health and wellness. Talk with your health care provider about what schedule of regular examinations is right for you. This is a good chance for you to check in with your provider about disease prevention and staying healthy. In between checkups, there are plenty of things you can do on your own. Experts have done a lot of research about which lifestyle changes and preventive measures are most likely to keep you healthy. Ask your health care provider for more information. Weight and diet Eat a healthy diet  Be sure to include plenty of vegetables, fruits, low-fat dairy products, and lean protein.  Do not eat a lot of foods high in solid fats, added sugars, or salt.  Get regular exercise. This is one of the most important things you can do for your health. ? Most adults should exercise for at least 150 minutes each week. The exercise should increase your heart rate and make you sweat (moderate-intensity exercise). ? Most adults should also do strengthening exercises at least twice a week. This is in addition to the moderate-intensity exercise.  Maintain a healthy weight  Body mass index (BMI) is a measurement that can be used to identify possible weight problems. It estimates body fat based on height and weight. Your health care provider can help determine your BMI and help you achieve or maintain a healthy weight.  For females 52 years of age and older: ? A BMI below 18.5 is considered underweight. ? A BMI of 18.5 to 24.9 is normal. ? A BMI of 25 to 29.9 is considered overweight. ? A BMI of 30 and above is  considered obese.  Watch levels of cholesterol and blood lipids  You should start having your blood tested for lipids and cholesterol at 56 years of age, then have this test every 5 years.  You may need to have your cholesterol levels checked more often if: ? Your lipid or cholesterol levels are high. ? You are older than 56 years of age. ? You are at high risk for heart disease.  Cancer screening Lung Cancer  Lung cancer screening is recommended for adults 7-63 years old who are at high risk for lung cancer because of a history of smoking.  A yearly low-dose CT scan of the lungs is recommended for people who: ? Currently smoke. ? Have quit within the past 15 years. ? Have at least a 30-pack-year history of smoking. A pack year is smoking an average of one pack of cigarettes a day for 1 year.  Yearly screening should continue until it has been 15 years since you quit.  Yearly screening should stop if you develop a health problem that would prevent you from having lung cancer treatment.  Breast Cancer  Practice breast self-awareness. This means understanding how your breasts normally appear and feel.  It also means doing regular breast self-exams. Let your health care provider know about any changes, no matter how small.  If you are in your 20s or 30s, you should have a clinical breast exam (CBE) by a health care provider every  1-3 years as part of a regular health exam.  If you are 23 or older, have a CBE every year. Also consider having a breast X-ray (mammogram) every year.  If you have a family history of breast cancer, talk to your health care provider about genetic screening.  If you are at high risk for breast cancer, talk to your health care provider about having an MRI and a mammogram every year.  Breast cancer gene (BRCA) assessment is recommended for women who have family members with BRCA-related cancers. BRCA-related cancers  include: ? Breast. ? Ovarian. ? Tubal. ? Peritoneal cancers.  Results of the assessment will determine the need for genetic counseling and BRCA1 and BRCA2 testing.  Cervical Cancer Your health care provider may recommend that you be screened regularly for cancer of the pelvic organs (ovaries, uterus, and vagina). This screening involves a pelvic examination, including checking for microscopic changes to the surface of your cervix (Pap test). You may be encouraged to have this screening done every 3 years, beginning at age 71.  For women ages 51-65, health care providers may recommend pelvic exams and Pap testing every 3 years, or they may recommend the Pap and pelvic exam, combined with testing for human papilloma virus (HPV), every 5 years. Some types of HPV increase your risk of cervical cancer. Testing for HPV may also be done on women of any age with unclear Pap test results.  Other health care providers may not recommend any screening for nonpregnant women who are considered low risk for pelvic cancer and who do not have symptoms. Ask your health care provider if a screening pelvic exam is right for you.  If you have had past treatment for cervical cancer or a condition that could lead to cancer, you need Pap tests and screening for cancer for at least 20 years after your treatment. If Pap tests have been discontinued, your risk factors (such as having a new sexual partner) need to be reassessed to determine if screening should resume. Some women have medical problems that increase the chance of getting cervical cancer. In these cases, your health care provider may recommend more frequent screening and Pap tests.  Colorectal Cancer  This type of cancer can be detected and often prevented.  Routine colorectal cancer screening usually begins at 56 years of age and continues through 56 years of age.  Your health care provider may recommend screening at an earlier age if you have risk factors  for colon cancer.  Your health care provider may also recommend using home test kits to check for hidden blood in the stool.  A small camera at the end of a tube can be used to examine your colon directly (sigmoidoscopy or colonoscopy). This is done to check for the earliest forms of colorectal cancer.  Routine screening usually begins at age 30.  Direct examination of the colon should be repeated every 5-10 years through 56 years of age. However, you may need to be screened more often if early forms of precancerous polyps or small growths are found.  Skin Cancer  Check your skin from head to toe regularly.  Tell your health care provider about any new moles or changes in moles, especially if there is a change in a mole's shape or color.  Also tell your health care provider if you have a mole that is larger than the size of a pencil eraser.  Always use sunscreen. Apply sunscreen liberally and repeatedly throughout the day.  Protect  yourself by wearing long sleeves, pants, a wide-brimmed hat, and sunglasses whenever you are outside.  Heart disease, diabetes, and high blood pressure  High blood pressure causes heart disease and increases the risk of stroke. High blood pressure is more likely to develop in: ? People who have blood pressure in the high end of the normal range (130-139/85-89 mm Hg). ? People who are overweight or obese. ? People who are African American.  If you are 76-29 years of age, have your blood pressure checked every 3-5 years. If you are 55 years of age or older, have your blood pressure checked every year. You should have your blood pressure measured twice-once when you are at a hospital or clinic, and once when you are not at a hospital or clinic. Record the average of the two measurements. To check your blood pressure when you are not at a hospital or clinic, you can use: ? An automated blood pressure machine at a pharmacy. ? A home blood pressure monitor.  If  you are between 64 years and 74 years old, ask your health care provider if you should take aspirin to prevent strokes.  Have regular diabetes screenings. This involves taking a blood sample to check your fasting blood sugar level. ? If you are at a normal weight and have a low risk for diabetes, have this test once every three years after 56 years of age. ? If you are overweight and have a high risk for diabetes, consider being tested at a younger age or more often. Preventing infection Hepatitis B  If you have a higher risk for hepatitis B, you should be screened for this virus. You are considered at high risk for hepatitis B if: ? You were born in a country where hepatitis B is common. Ask your health care provider which countries are considered high risk. ? Your parents were born in a high-risk country, and you have not been immunized against hepatitis B (hepatitis B vaccine). ? You have HIV or AIDS. ? You use needles to inject street drugs. ? You live with someone who has hepatitis B. ? You have had sex with someone who has hepatitis B. ? You get hemodialysis treatment. ? You take certain medicines for conditions, including cancer, organ transplantation, and autoimmune conditions.  Hepatitis C  Blood testing is recommended for: ? Everyone born from [birth year] through 1964. ? Anyone with known risk factors for hepatitis C.  Sexually transmitted infections (STIs)  You should be screened for sexually transmitted infections (STIs) including gonorrhea and chlamydia if: ? You are sexually active and are younger than 56 years of age. ? You are older than 56 years of age and your health care provider tells you that you are at risk for this type of infection. ? Your sexual activity has changed since you were last screened and you are at an increased risk for chlamydia or gonorrhea. Ask your health care provider if you are at risk.  If you do not have HIV, but are at risk, it may be recommended  that you take a prescription medicine daily to prevent HIV infection. This is called pre-exposure prophylaxis (PrEP). You are considered at risk if: ? You are sexually active and do not regularly use condoms or know the HIV status of your partner(s). ? You take drugs by injection. ? You are sexually active with a partner who has HIV.  Talk with your health care provider about whether you are at high risk of being  infected with HIV. If you choose to begin PrEP, you should first be tested for HIV. You should then be tested every 3 months for as long as you are taking PrEP. Pregnancy  If you are premenopausal and you may become pregnant, ask your health care provider about preconception counseling.  If you may become pregnant, take 400 to 800 micrograms (mcg) of folic acid every day.  If you want to prevent pregnancy, talk to your health care provider about birth control (contraception). Osteoporosis and menopause  Osteoporosis is a disease in which the bones lose minerals and strength with aging. This can result in serious bone fractures. Your risk for osteoporosis can be identified using a bone density scan.  If you are 68 years of age or older, or if you are at risk for osteoporosis and fractures, ask your health care provider if you should be screened.  Ask your health care provider whether you should take a calcium or vitamin D supplement to lower your risk for osteoporosis.  Menopause may have certain physical symptoms and risks.  Hormone replacement therapy may reduce some of these symptoms and risks. Talk to your health care provider about whether hormone replacement therapy is right for you. Follow these instructions at home:  Schedule regular health, dental, and eye exams.  Stay current with your immunizations.  Do not use any tobacco products including cigarettes, chewing tobacco, or electronic cigarettes.  If you are pregnant, do not drink alcohol.  If you are  breastfeeding, limit how much and how often you drink alcohol.  Limit alcohol intake to no more than 1 drink per day for nonpregnant women. One drink equals 12 ounces of beer, 5 ounces of wine, or 1 ounces of hard liquor.  Do not use street drugs.  Do not share needles.  Ask your health care provider for help if you need support or information about quitting drugs.  Tell your health care provider if you often feel depressed.  Tell your health care provider if you have ever been abused or do not feel safe at home. This information is not intended to replace advice given to you by your health care provider. Make sure you discuss any questions you have with your health care provider. Document Released: 12/07/2010 Document Revised: 10/30/2015 Document Reviewed: 02/25/2015 Elsevier Interactive Patient Education  [employer].

## 2018-04-27 ENCOUNTER — Other Ambulatory Visit (INDEPENDENT_AMBULATORY_CARE_PROVIDER_SITE_OTHER): Payer: Managed Care, Other (non HMO)

## 2018-04-27 ENCOUNTER — Encounter: Payer: Self-pay | Admitting: Family Medicine

## 2018-04-27 DIAGNOSIS — Z8571 Personal history of Hodgkin lymphoma: Secondary | ICD-10-CM

## 2018-04-27 DIAGNOSIS — Z131 Encounter for screening for diabetes mellitus: Secondary | ICD-10-CM | POA: Diagnosis not present

## 2018-04-27 DIAGNOSIS — R7303 Prediabetes: Secondary | ICD-10-CM

## 2018-04-27 DIAGNOSIS — E782 Mixed hyperlipidemia: Secondary | ICD-10-CM

## 2018-04-27 LAB — COMPREHENSIVE METABOLIC PANEL
ALT: 23 U/L (ref 0–35)
AST: 23 U/L (ref 0–37)
Albumin: 4.7 g/dL (ref 3.5–5.2)
Alkaline Phosphatase: 76 U/L (ref 39–117)
BILIRUBIN TOTAL: 0.8 mg/dL (ref 0.2–1.2)
BUN: 18 mg/dL (ref 6–23)
CALCIUM: 9.5 mg/dL (ref 8.4–10.5)
CHLORIDE: 107 meq/L (ref 96–112)
CO2: 26 meq/L (ref 19–32)
CREATININE: 0.97 mg/dL (ref 0.40–1.20)
GFR: 63.13 mL/min (ref >60.00)
GLUCOSE: 103 mg/dL — AB (ref 70–99)
Potassium: 3.8 mEq/L (ref 3.5–5.1)
Sodium: 143 mEq/L (ref 135–145)
Total Protein: 6.9 g/dL (ref 6.0–8.3)

## 2018-04-27 LAB — CBC WITH DIFFERENTIAL/PLATELET
BASOS PCT: 1.2 % (ref 0.0–3.0)
Basophils Absolute: 0.1 10*3/uL (ref 0.0–0.1)
EOS PCT: 4.9 % (ref 0.0–5.0)
Eosinophils Absolute: 0.2 10*3/uL (ref 0.0–0.7)
HEMATOCRIT: 41.1 % (ref 36.0–46.0)
Hemoglobin: 14 g/dL (ref 12.0–15.0)
LYMPHS ABS: 1.4 10*3/uL (ref 0.7–4.0)
LYMPHS PCT: 30.6 % (ref 12.0–46.0)
MCHC: 34.2 g/dL (ref 30.0–36.0)
MCV: 86.7 fl (ref 78.0–100.0)
Monocytes Absolute: 0.4 10*3/uL (ref 0.1–1.0)
Monocytes Relative: 8.4 % (ref 3.0–12.0)
NEUTROS ABS: 2.5 10*3/uL (ref 1.4–7.7)
NEUTROS PCT: 54.9 % (ref 43.0–77.0)
PLATELETS: 236 10*3/uL (ref 150.0–400.0)
RBC: 4.74 Mil/uL (ref 3.87–5.11)
RDW: 13.1 % (ref 11.5–15.5)
WBC: 4.5 10*3/uL (ref 4.0–10.5)

## 2018-04-27 LAB — LIPID PANEL
CHOL/HDL RATIO: 3
Cholesterol: 172 mg/dL (ref 0–200)
HDL: 50.5 mg/dL (ref >39.00)
LDL Cholesterol: 102 mg/dL — ABNORMAL HIGH (ref 0–99)
NONHDL: 121.5
Triglycerides: 97 mg/dL (ref 0.0–149.0)
VLDL: 19.4 mg/dL (ref 0.0–40.0)

## 2018-04-27 LAB — HEMOGLOBIN A1C: HEMOGLOBIN A1C: 6 % (ref 4.6–6.5)

## 2018-06-05 ENCOUNTER — Encounter: Payer: Self-pay | Admitting: Family Medicine

## 2018-06-05 DIAGNOSIS — Z8639 Personal history of other endocrine, nutritional and metabolic disease: Secondary | ICD-10-CM

## 2018-06-06 NOTE — Telephone Encounter (Signed)
90 day supply with 3 refills was sent to pharmacy on 04/24/18. Please advise about mammogram order.

## 2018-08-08 ENCOUNTER — Encounter: Payer: Self-pay | Admitting: Family Medicine

## 2018-08-08 DIAGNOSIS — Z8639 Personal history of other endocrine, nutritional and metabolic disease: Secondary | ICD-10-CM

## 2018-08-08 MED ORDER — ATORVASTATIN CALCIUM 10 MG PO TABS: 10.0000 mg | ORAL_TABLET | Freq: Every day | ORAL | 3 refills | Status: DC

## 2018-08-09 MED FILL — ATORVASTATIN 10 MG TABLET: 10 | 90 days supply | Qty: 90 | Fill #0

## 2018-08-11 ENCOUNTER — Other Ambulatory Visit: Payer: Self-pay | Admitting: Family Medicine

## 2018-08-11 DIAGNOSIS — Z1231 Encounter for screening mammogram for malignant neoplasm of breast: Secondary | ICD-10-CM

## 2018-12-01 MED FILL — ATORVASTATIN 10 MG TABLET: 10 | 90 days supply | Qty: 90 | Fill #1

## 2019-01-22 ENCOUNTER — Ambulatory Visit: Payer: Managed Care, Other (non HMO)

## 2019-03-06 ENCOUNTER — Ambulatory Visit: Payer: Managed Care, Other (non HMO)

## 2019-03-07 ENCOUNTER — Other Ambulatory Visit: Payer: Self-pay

## 2019-03-07 ENCOUNTER — Ambulatory Visit
Admission: RE | Admit: 2019-03-07 | Discharge: 2019-03-07 | Disposition: A | Discharge status: Home / Self Care | Payer: 59 | Source: Ambulatory Visit | Attending: Family Medicine | Admitting: Family Medicine

## 2019-03-07 DIAGNOSIS — Z1231 Encounter for screening mammogram for malignant neoplasm of breast: Secondary | ICD-10-CM

## 2019-04-19 ENCOUNTER — Encounter: Payer: Self-pay | Admitting: Family Medicine

## 2019-04-19 DIAGNOSIS — Z8639 Personal history of other endocrine, nutritional and metabolic disease: Secondary | ICD-10-CM

## 2019-04-20 MED ORDER — ATORVASTATIN CALCIUM 10 MG PO TABS: 10.0000 mg | ORAL_TABLET | Freq: Every day | ORAL | 3 refills | Status: DC

## 2019-07-26 ENCOUNTER — Ambulatory Visit: Payer: 59

## 2019-07-30 ENCOUNTER — Ambulatory Visit: Payer: 59 | Attending: Family

## 2019-07-30 DIAGNOSIS — Z23 Encounter for immunization: Secondary | ICD-10-CM

## 2019-07-30 NOTE — Progress Notes (Signed)
   Covid-19 Vaccination Clinic  Name:  Jennifer Wheeler    MRN: [id number] DOB: 11/08/1960  07/30/2019  [name] was observed post Covid-19 immunization for 15 minutes without incidence. She was provided with Vaccine Information Sheet and instruction to access the V-Safe system.   [name] was instructed to call 911 with any severe reactions post vaccine: Marland Kitchen Difficulty breathing  . Swelling of your face and throat  . A fast heartbeat  . A bad rash all over your body  . Dizziness and weakness    Immunizations Administered    Name Date Dose VIS Date Route   Moderna COVID-19 Vaccine 07/30/2019  4:00 PM 0.5 mL 05/08/2019 Intramuscular   Manufacturer: Moderna   Lot: [id number]   [id number]

## 2019-08-28 ENCOUNTER — Ambulatory Visit: Payer: 59 | Attending: Family

## 2019-08-28 DIAGNOSIS — Z23 Encounter for immunization: Secondary | ICD-10-CM

## 2019-08-28 NOTE — Progress Notes (Signed)
   Covid-19 Vaccination Clinic  Name:  [name]    MRN: [id number] DOB: 1960/10/08  08/28/2019  [name] was observed post Covid-19 immunization for 15 minutes without incident. She was provided with Vaccine Information Sheet and instruction to access the V-Safe system.   [name] was instructed to call 911 with any severe reactions post vaccine: Marland Kitchen Difficulty breathing  . Swelling of face and throat  . A fast heartbeat  . A bad rash all over body  . Dizziness and weakness   Immunizations Administered    Name Date Dose VIS Date Route   Moderna COVID-19 Vaccine 08/28/2019  3:50 PM 0.5 mL 05/08/2019 Intramuscular   Manufacturer: Moderna   LotMV:[id number]   [place]BE:[id number]

## 2019-09-04 ENCOUNTER — Ambulatory Visit: Payer: 59

## 2019-12-10 NOTE — Progress Notes (Signed)
[facility] [place], [place], [place] [address] [phone]  Date:  12/13/2019   Name:  Jennifer Wheeler   DOB:  10/15/1960   MRN:  [id number]  PCP:  Darreld Mclean, MD    Chief Complaint: Annual Exam (pap smear)   History of Present Illness:  Jennifer Wheeler is a 59 y.o. 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 (he is a sports med Firefighter) Dx with [place] in 2010- she had chemo for 6 months. A year ago she had her 5 year follow-up and was told that she does not need to see oncology any longer. She does need to have annual labs per her PCP She takes lipitor for her choleterol.  She is otherwise doing well.  She has never been a smoker They have 3 children- the older children are out of school, live [place] and [place], and the youngest is a Ship broker at [employer] [place].   Son in [place], married and expecting in January- first grand child  Son in [place], married Youngest son is finishing up med school at [employer]- plans to go into IM  Her husband is now working with [employer] in [place]- this is going ok for him   Pap-due today, never had an abnormal in the past Mammogram up-to-date Colon cancer screen up-to-date COVID-19 series complete Shingrix- not done yet Due for routine labs  Asa 81 lipitor Vitamin  Patient overall is doing  very well, her main concern is that she continues to battle with her weight and she has noticed some thinning of her hair over the last 6 to 7 months.  No bald spots, more of a generalized thinning.  She tends to have fine hair to begin with Patient Active Problem List   Diagnosis Date Noted  . Pre-diabetes 04/14/2017  . History of Hodgkin's lymphoma 09/04/2015  . Hyperlipidemia 09/04/2015    Past Medical History:  Diagnosis Date  . Dense breast tissue   . Hodgkin's lymphoma ([place]) 2010  . Hypercholesteremia     Past Surgical History:  Procedure Laterality Date  . CESAREAN SECTION  1991    Social History   Tobacco Use  . Smoking status: Never Smoker  . Smokeless tobacco: Never Used  Substance Use Topics  . Alcohol use: Yes    Alcohol/week: 0.0 standard drinks    Comment: occasionally  . Drug use: No    Family History  Problem Relation Age of Onset  . Heart disease Mother   . Heart attack Mother   . Hypertension Mother   . Kidney Stones Father   . Hyperlipidemia Maternal Grandmother   . Heart disease Maternal Grandmother   . Melanoma Sister   . Kidney Stones Sister   . Colon cancer Maternal Grandfather   . Diabetes Maternal Grandfather   . Breast cancer Paternal Grandmother   . High blood pressure  Paternal Grandmother     No Known Allergies  Medication list has been reviewed and updated.  Current Outpatient Medications on File Prior to Visit  Medication Sig Dispense Refill  . aspirin 81 MG tablet Take 1 tablet (81 mg total) by mouth daily. 90 tablet 3  . atorvastatin (LIPITOR) 10 MG tablet Take 1 tablet (10 mg total) by mouth daily. 90 tablet 3  . BIOTIN 5000 PO Take 5,000 mcg by mouth daily.    . Multiple Vitamins-Minerals (MULTIPLE VITAMINS/WOMENS PO) Take 1 tablet by mouth daily.     No current facility-administered medications on file prior to visit.    Review of Systems:  As per HPI- otherwise negative.   Physical Examination: Vitals:   12/13/19  1011  BP: 132/82  Pulse: 80  Resp: 17  Temp: 97.8 F (36.6 C)  SpO2: 98%   Vitals:   12/13/19 1011  Weight: 178 lb (80.7 kg)  Height: 5\' 4"  (1.626 m)   Body mass index is 30.55 kg/m. Ideal Body Weight: Weight in (lb) to have BMI = 25: 145.3  GEN: no acute distress.  Obese, otherwise looks well  She does have fine and somewhat thin hair, no bald spots noted, normal hairline HEENT: Atraumatic, Normocephalic.   Bilateral TM wnl, oropharynx normal.  PEERL,EOMI.   Ears and Nose: No external deformity. CV: RRR, No M/G/R. No JVD. No thrill. No extra heart sounds. PULM: CTA B, no wheezes, crackles, rhonchi. No retractions. No resp. distress. No accessory muscle use. ABD: S, NT, ND, +BS. No rebound. No HSM. EXTR: No c/c/e PSYCH: Normally interactive. Conversant.  Normal vulva, vagina, cervix and adnexa Pap collected    Assessment and Plan: Physical exam  History of hyperlipidemia - Plan: Lipid panel  Screening for deficiency anemia - Plan: CBC with Differential/Platelet  Pre-diabetes - Plan: Hemoglobin A1c, Comprehensive metabolic panel  History of Hodgkin's lymphoma  Screening for thyroid disorder - Plan: TSH  Encounter for vitamin deficiency screening - Plan: VITAMIN D 25 Hydroxy (Vit-D Deficiency, Fractures)  Screening for cervical cancer - Plan: Cytology - PAP  Hair loss - Plan: TSH, Ferritin  Encounter for screening mammogram for malignant neoplasm of breast - Plan: MM 3D SCREEN BREAST BILATERAL  Screening for skin cancer - Plan: Ambulatory referral to Dermatology  Here today for a complete physical.  Labs pending as above.  We will be in touch with patient pending results Pap collected Order mammogram For hair loss, will check a thyroid and ferritin.  Assuming these are normal, would suggest over-the-counter topical Rogaine Referral to dermatology for skin cancer screening Encouraged healthy diet, exercise.  Patient does not smoke or drink to excess This visit  occurred during the SARS-CoV-2 public health emergency.  Safety protocols were in place, including screening questions prior to the visit, additional usage of staff PPE, and extensive cleaning of exam room while observing appropriate contact time as indicated for disinfecting solutions.    Signed Lamar Blinks, MD

## 2019-12-10 NOTE — Patient Instructions (Addendum)
It was great to see you again today, I will be in touch with your labs soon as possible Mammogram ordered for this fall I placed a referral for you to be seen with dermatology:  [facility] [place], [place], [place] [address]  I will make sure your iron or thyroid levels are not off- this could cause hair loss.  Assuming ok here, I would suggest starting on OTC topical rogaine to help re-grow and thicken hari  Take care!  Health Maintenance, Female Adopting a healthy lifestyle and getting preventive care are important in promoting health and wellness. Ask your health care provider about:  The right schedule for you to have regular tests and exams.  Things you can do on your own to prevent diseases and keep yourself healthy. What should I know about diet, weight, and exercise? Eat a healthy diet   Eat a diet that includes plenty of vegetables, fruits, low-fat dairy products, and lean protein.  Do not eat a lot of foods that are high in solid fats, added sugars, or sodium. Maintain a healthy weight Body mass index (BMI) is used to identify weight problems. It estimates body fat based on height and weight. Your health care provider can help determine your BMI and help you achieve or maintain a healthy weight. Get regular exercise Get regular exercise. This is one of the most important things you can do for your health. Most adults should:  Exercise for at least 150 minutes each week. The exercise should increase your heart rate and make you sweat (moderate-intensity exercise).  Do strengthening exercises at least twice a week. This is in addition to the moderate-intensity exercise.  Spend less time sitting. Even light physical activity can be beneficial. Watch cholesterol and blood lipids Have your blood tested for lipids and cholesterol at 58 years of age, then have this test every 5 years. Have your cholesterol levels checked more often if:  Your  lipid or cholesterol levels are high.  You are older than 58 years of age.  You are at high risk for heart disease. What should I know about cancer screening? Depending on your health history and family history, you may need to have cancer screening at various ages. This may include screening for:  Breast cancer.  Cervical cancer.  Colorectal cancer.  Skin cancer.  Lung cancer. What should I know about heart disease, diabetes, and high blood pressure? Blood pressure and heart disease  High blood pressure causes heart disease and increases the risk of stroke. This is more likely to develop in people who have high blood pressure readings, are of African descent, or are overweight.  Have your blood pressure checked: ? Every 3-5 years if you are 33-57 years of age. ? Every year if you are 30 years old or older. Diabetes Have regular diabetes screenings. This checks your fasting blood sugar level. Have the screening done:  Once every three years after age 16 if you are at a normal weight and have a low risk for diabetes.  More often and at a younger age if you are overweight or have a high risk for diabetes. What should I know about preventing infection? Hepatitis B If you have a higher risk for hepatitis B, you should be screened for this virus. Talk with your health care provider to find out if you are at risk for hepatitis B infection. Hepatitis C Testing is recommended for:  Everyone born from [birth year] through 1964.  Anyone with known risk factors for hepatitis C. Sexually transmitted infections (STIs)  Get screened for STIs, including gonorrhea and chlamydia, if: ? You are sexually active and are younger than 58 years of age. ? You are older than 58 years of age and your health care provider tells you that you are at risk for this type of infection. ? Your sexual activity has changed since you were last screened, and you are at increased risk for chlamydia or gonorrhea. Ask  your health care provider if you are at risk.  Ask your health care provider about whether you are at high risk for HIV. Your health care provider may recommend a prescription medicine to help prevent HIV infection. If you choose to take medicine to prevent HIV, you should first get tested for HIV. You should then be tested every 3 months for as long as you are taking the medicine. Pregnancy  If you are about to stop having your period (premenopausal) and you may become pregnant, seek counseling before you get pregnant.  Take 400 to 800 micrograms (mcg) of folic acid every day if you become pregnant.  Ask for birth control (contraception) if you want to prevent pregnancy. Osteoporosis and menopause Osteoporosis is a disease in which the bones lose minerals and strength with aging. This can result in bone fractures. If you are 46 years old or older, or if you are at risk for osteoporosis and fractures, ask your health care provider if you should:  Be screened for bone loss.  Take a calcium or vitamin D supplement to lower your risk of fractures.  Be given hormone replacement therapy (HRT) to treat symptoms of menopause. Follow these instructions at home: Lifestyle  Do not use any products that contain nicotine or tobacco, such as cigarettes, e-cigarettes, and chewing tobacco. If you need help quitting, ask your health care provider.  Do not use street drugs.  Do not share needles.  Ask your health care provider for help if you need support or information about quitting drugs. Alcohol use  Do not drink alcohol if: ? Your health care provider tells you not to drink. ? You are pregnant, may be pregnant, or are planning to become pregnant.  If you drink alcohol: ? Limit how much you use to 0-1 drink a day. ? Limit intake if you are breastfeeding.  Be aware of how much alcohol is in your drink. In the [place] one drink equals one 12 oz bottle of beer (355 mL), one 5 oz glass of wine  (148 mL), or one 1 oz glass of hard liquor (44 mL). General instructions  Schedule regular health, dental, and eye exams.  Stay current with your vaccines.  Tell your health care provider if: ? You often feel depressed. ? You have ever been abused or do not feel safe at home. Summary  Adopting a healthy lifestyle and getting preventive care are important in promoting health and wellness.  Follow your health care provider's instructions about healthy diet, exercising, and getting tested or screened for diseases.  Follow your health care provider's instructions on monitoring your cholesterol and blood pressure. This information is not intended to replace advice given to you by your health care provider. Make sure you discuss any questions you have with your health care provider. Document Revised: 05/17/2018 Document Reviewed: 05/17/2018 Elsevier Patient Education  2020 [employer].

## 2019-12-13 ENCOUNTER — Encounter: Payer: Self-pay | Admitting: Family Medicine

## 2019-12-13 ENCOUNTER — Other Ambulatory Visit (HOSPITAL_COMMUNITY)
Admission: RE | Admit: 2019-12-13 | Discharge: 2019-12-13 | Disposition: A | Discharge status: Home / Self Care | Payer: 59 | Source: Ambulatory Visit | Attending: Family Medicine | Admitting: Family Medicine

## 2019-12-13 ENCOUNTER — Ambulatory Visit (INDEPENDENT_AMBULATORY_CARE_PROVIDER_SITE_OTHER): Payer: 59 | Admitting: Family Medicine

## 2019-12-13 ENCOUNTER — Other Ambulatory Visit: Payer: Self-pay

## 2019-12-13 VITALS — BP 132/82 | HR 80 | Temp 97.8°F | Resp 17 | Ht 64.0 in | Wt 178.0 lb

## 2019-12-13 DIAGNOSIS — Z0000 Encounter for general adult medical examination without abnormal findings: Secondary | ICD-10-CM | POA: Diagnosis not present

## 2019-12-13 DIAGNOSIS — Z124 Encounter for screening for malignant neoplasm of cervix: Secondary | ICD-10-CM | POA: Diagnosis present

## 2019-12-13 DIAGNOSIS — Z1321 Encounter for screening for nutritional disorder: Secondary | ICD-10-CM

## 2019-12-13 DIAGNOSIS — L659 Nonscarring hair loss, unspecified: Secondary | ICD-10-CM | POA: Diagnosis not present

## 2019-12-13 DIAGNOSIS — Z1329 Encounter for screening for other suspected endocrine disorder: Secondary | ICD-10-CM

## 2019-12-13 DIAGNOSIS — R7303 Prediabetes: Secondary | ICD-10-CM

## 2019-12-13 DIAGNOSIS — Z1283 Encounter for screening for malignant neoplasm of skin: Secondary | ICD-10-CM

## 2019-12-13 DIAGNOSIS — Z8639 Personal history of other endocrine, nutritional and metabolic disease: Secondary | ICD-10-CM | POA: Diagnosis not present

## 2019-12-13 DIAGNOSIS — Z8571 Personal history of Hodgkin lymphoma: Secondary | ICD-10-CM

## 2019-12-13 DIAGNOSIS — Z130 Encounter for screening for diseases of the blood and blood-forming organs and certain disorders involving the immune mechanism: Secondary | ICD-10-CM | POA: Diagnosis not present

## 2019-12-13 DIAGNOSIS — Z1231 Encounter for screening mammogram for malignant neoplasm of breast: Secondary | ICD-10-CM

## 2019-12-13 LAB — COMPREHENSIVE METABOLIC PANEL
ALT: 26 U/L (ref 0–35)
AST: 25 U/L (ref 0–37)
Albumin: 4.8 g/dL (ref 3.5–5.2)
Alkaline Phosphatase: 82 U/L (ref 39–117)
BUN: 16 mg/dL (ref 6–23)
CO2: 28 mEq/L (ref 19–32)
Calcium: 9.7 mg/dL (ref 8.4–10.5)
Chloride: 102 mEq/L (ref 96–112)
Creatinine, Ser: 0.88 mg/dL (ref 0.40–1.20)
GFR: 66.08 mL/min (ref >60.00)
Glucose, Bld: 105 mg/dL — ABNORMAL HIGH (ref 70–99)
Potassium: 4.4 mEq/L (ref 3.5–5.1)
Sodium: 139 mEq/L (ref 135–145)
Total Bilirubin: 0.7 mg/dL (ref 0.2–1.2)
Total Protein: 7 g/dL (ref 6.0–8.3)

## 2019-12-13 LAB — CBC WITH DIFFERENTIAL/PLATELET
Basophils Absolute: 0.1 10*3/uL (ref 0.0–0.1)
Basophils Relative: 1.8 % (ref 0.0–3.0)
Eosinophils Absolute: 0.2 10*3/uL (ref 0.0–0.7)
Eosinophils Relative: 3.5 % (ref 0.0–5.0)
HCT: 41.1 % (ref 36.0–46.0)
Hemoglobin: 13.9 g/dL (ref 12.0–15.0)
Lymphocytes Relative: 19.8 % (ref 12.0–46.0)
Lymphs Abs: 0.9 10*3/uL (ref 0.7–4.0)
MCHC: 33.7 g/dL (ref 30.0–36.0)
MCV: 87 fl (ref 78.0–100.0)
Monocytes Absolute: 0.4 10*3/uL (ref 0.1–1.0)
Monocytes Relative: 8.4 % (ref 3.0–12.0)
Neutro Abs: 3.1 10*3/uL (ref 1.4–7.7)
Neutrophils Relative %: 66.5 % (ref 43.0–77.0)
Platelets: 231 10*3/uL (ref 150.0–400.0)
RBC: 4.72 Mil/uL (ref 3.87–5.11)
RDW: 13.6 % (ref 11.5–15.5)
WBC: 4.7 10*3/uL (ref 4.0–10.5)

## 2019-12-13 LAB — TSH: TSH: 1.81 u[IU]/mL (ref 0.35–4.50)

## 2019-12-13 LAB — LIPID PANEL
Cholesterol: 180 mg/dL (ref 0–200)
HDL: 50.2 mg/dL (ref >39.00)
LDL Cholesterol: 110 mg/dL — ABNORMAL HIGH (ref 0–99)
NonHDL: 129.98
Total CHOL/HDL Ratio: 4
Triglycerides: 100 mg/dL (ref 0.0–149.0)
VLDL: 20 mg/dL (ref 0.0–40.0)

## 2019-12-13 LAB — HEMOGLOBIN A1C: Hgb A1c MFr Bld: 5.9 % (ref 4.6–6.5)

## 2019-12-13 LAB — FERRITIN: Ferritin: 66.6 ng/mL (ref 10.0–291.0)

## 2019-12-13 LAB — VITAMIN D 25 HYDROXY (VIT D DEFICIENCY, FRACTURES): VITD: 46.76 ng/mL (ref 30.00–100.00)

## 2019-12-14 ENCOUNTER — Encounter: Payer: Self-pay | Admitting: Family Medicine

## 2019-12-14 LAB — CYTOLOGY - PAP
Comment: NEGATIVE
Diagnosis: NEGATIVE
High risk HPV: NEGATIVE

## 2020-02-12 ENCOUNTER — Encounter: Payer: Self-pay | Admitting: Family Medicine

## 2020-03-07 ENCOUNTER — Other Ambulatory Visit: Payer: Self-pay

## 2020-03-07 ENCOUNTER — Ambulatory Visit
Admission: RE | Admit: 2020-03-07 | Discharge: 2020-03-07 | Disposition: A | Discharge status: Home / Self Care | Payer: 59 | Source: Ambulatory Visit | Attending: Family Medicine | Admitting: Family Medicine

## 2020-03-07 DIAGNOSIS — Z1231 Encounter for screening mammogram for malignant neoplasm of breast: Secondary | ICD-10-CM

## 2020-03-30 ENCOUNTER — Encounter: Payer: Self-pay | Admitting: Family Medicine

## 2020-04-14 ENCOUNTER — Other Ambulatory Visit: Payer: Self-pay | Admitting: Family Medicine

## 2020-04-14 DIAGNOSIS — Z8639 Personal history of other endocrine, nutritional and metabolic disease: Secondary | ICD-10-CM

## 2020-04-15 ENCOUNTER — Encounter: Payer: Self-pay | Admitting: Family Medicine

## 2022-03-24 IMAGING — MG DIGITAL SCREENING BILAT W/ TOMO W/ CAD
8 series · 8 of 24 positions shown · non-contrast
Comparison: Previous exam(s).

CLINICAL DATA: Screening.

EXAM:
DIGITAL SCREENING BILATERAL MAMMOGRAM WITH TOMO AND CAD

[R CC synth-2D]
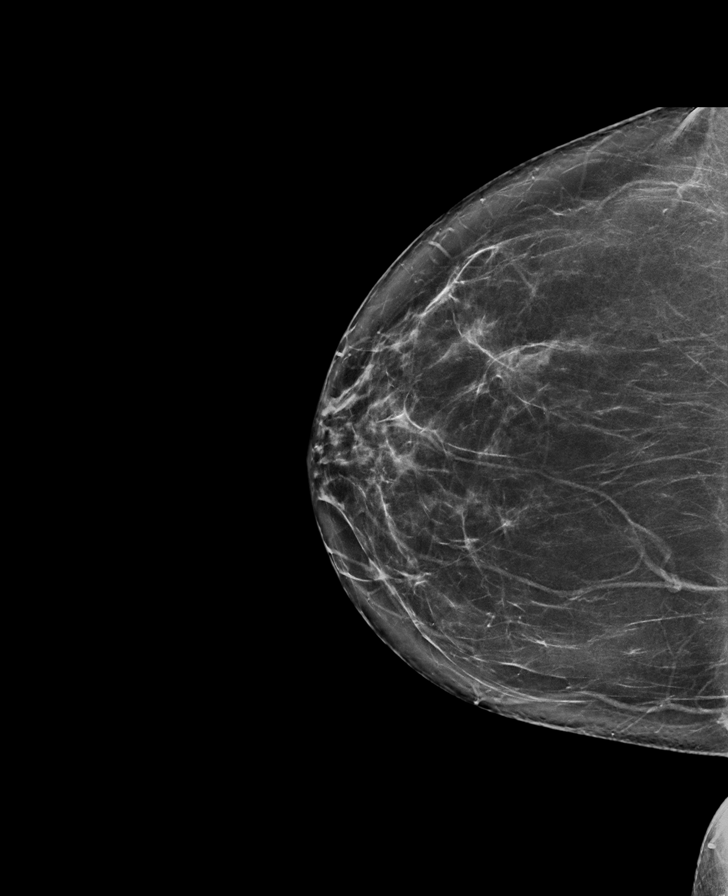

[L MLO synth-2D]
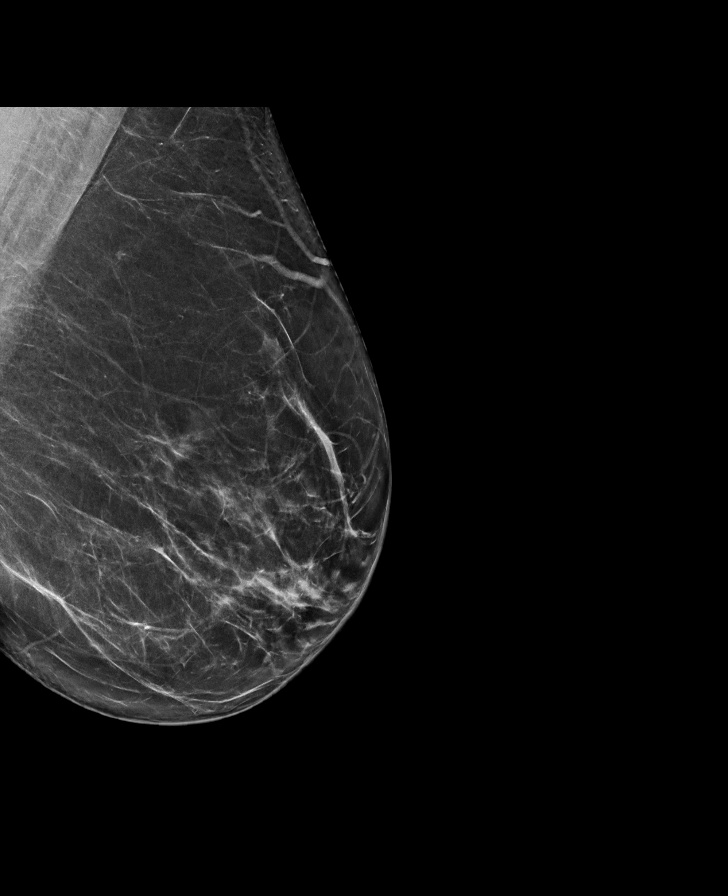

[L CC synth-2D]
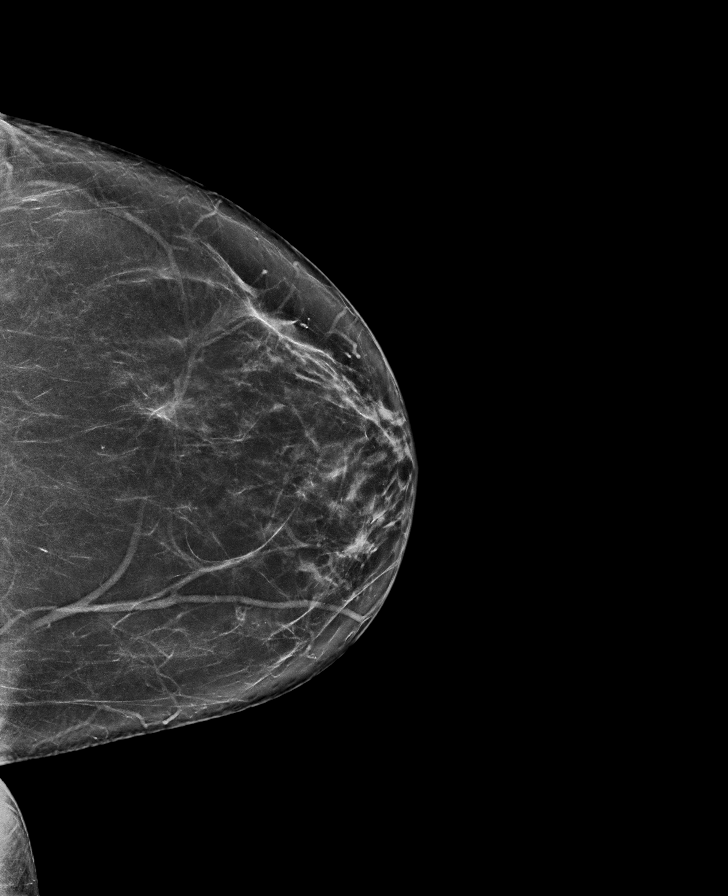

[R MLO synth-2D]
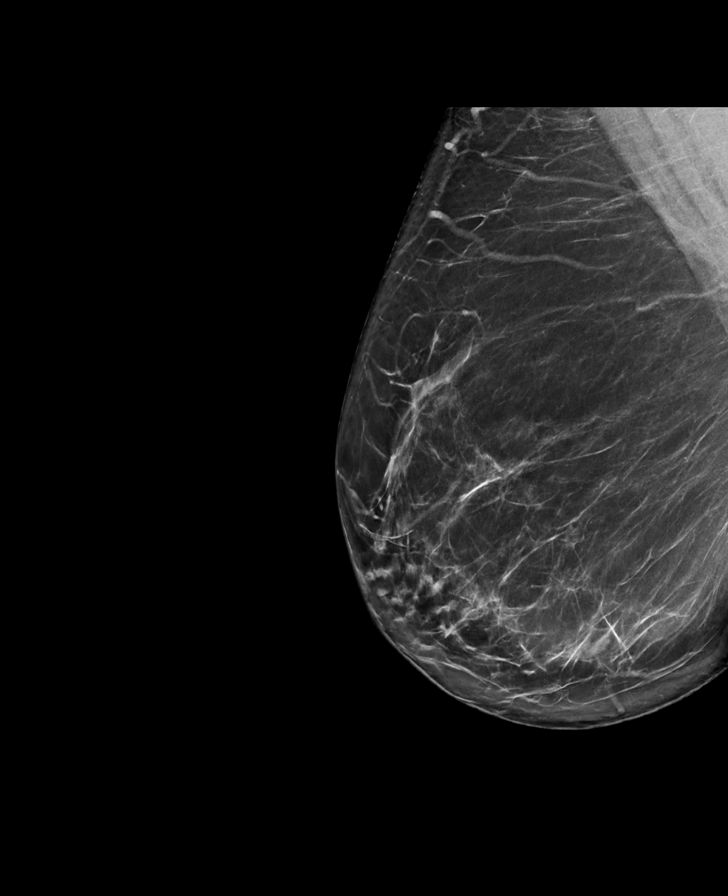

[R CC tomo · tomo slice 39/78.0]
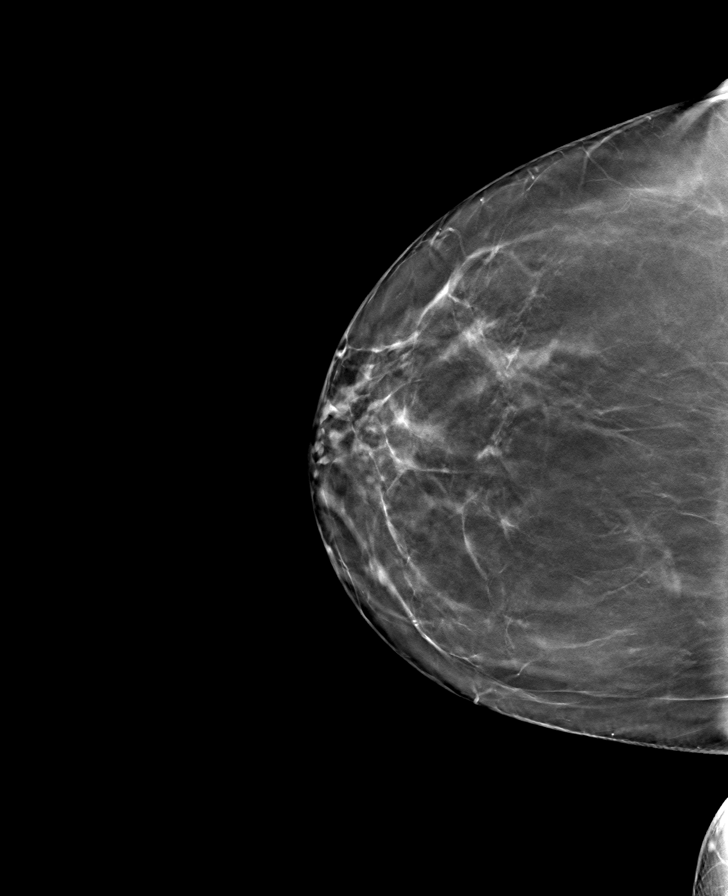

[L CC tomo · tomo slice 39/77.0]
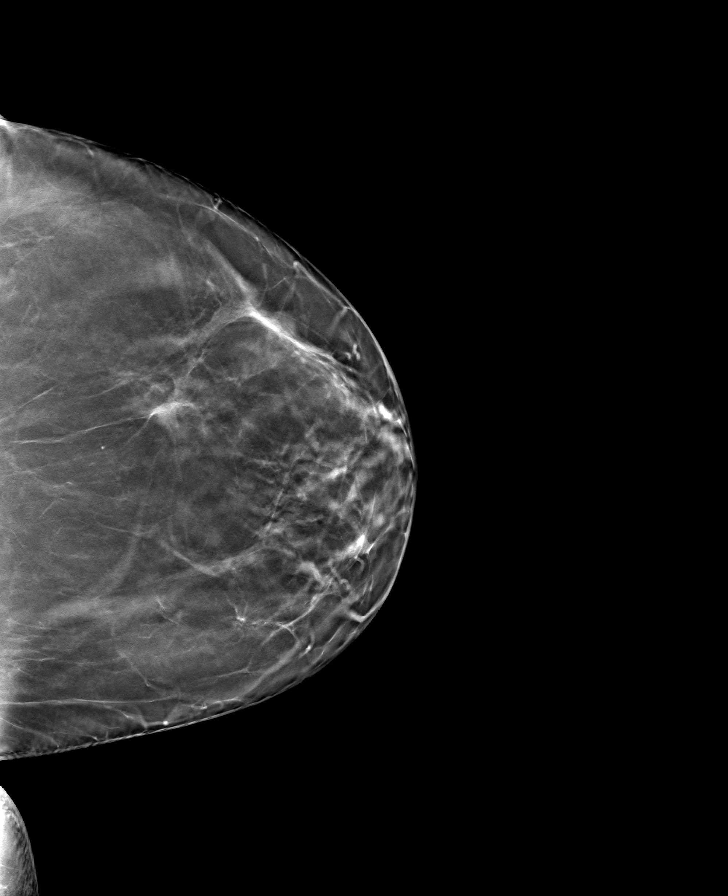

[L MLO tomo · tomo slice 41/81.0]
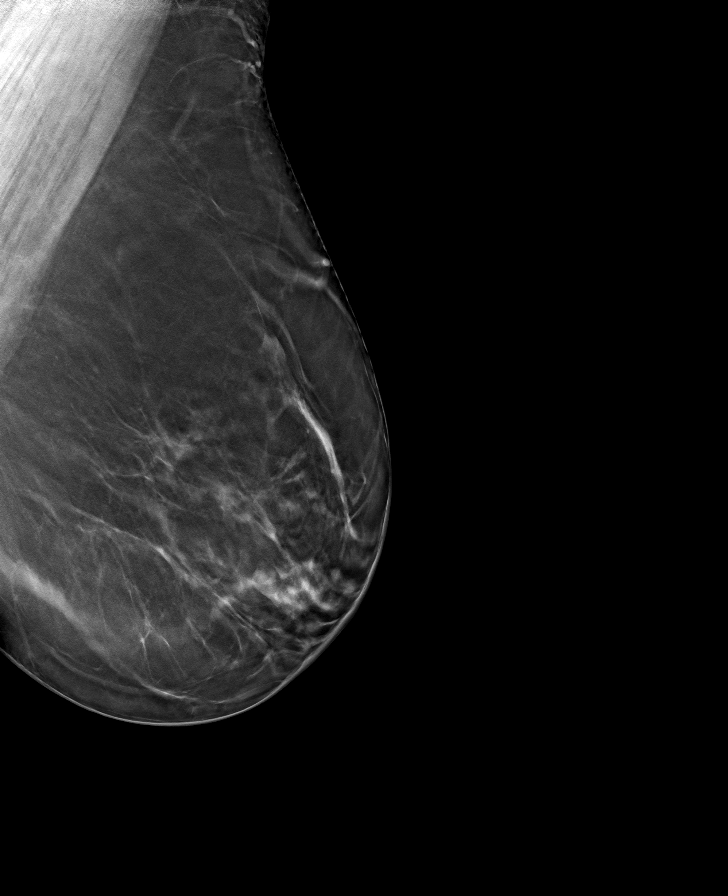

[R MLO tomo · tomo slice 43/86.0]
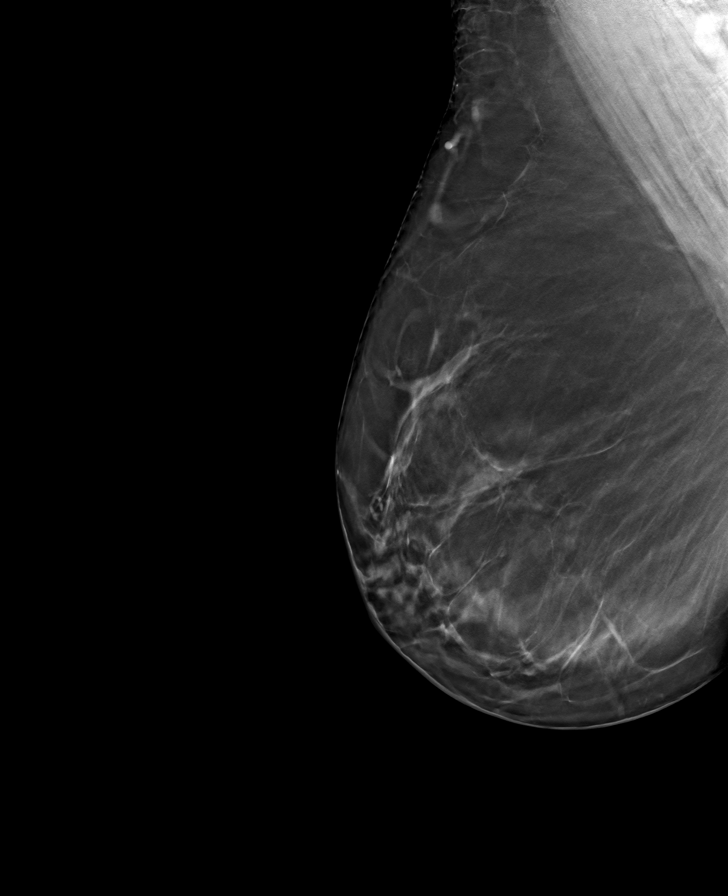

[8 of 24 positions shown; findings below may reference images not displayed]

ACR Breast Density Category b: There are scattered areas of
fibroglandular density.
FINDINGS: There are no findings suspicious for malignancy. Images were
processed with CAD.
IMPRESSION: No mammographic evidence of malignancy. A result letter of this
screening mammogram will be mailed directly to the patient.

RECOMMENDATION:
Screening mammogram in one year. (Code:[id number])

BI-RADS CATEGORY  1: Negative.
